# Patient Record
Sex: Female | Born: 1955 | Race: Black or African American | Hispanic: No | Marital: Single | Smoking: Former smoker
Health system: Southern US, Community
[De-identification: ages and names within clinical notes are randomized; demographics above are authoritative.]

## PROBLEM LIST (undated history)

## (undated) DIAGNOSIS — I2510 Atherosclerotic heart disease of native coronary artery without angina pectoris: Secondary | ICD-10-CM

## (undated) DIAGNOSIS — I739 Peripheral vascular disease, unspecified: Secondary | ICD-10-CM

## (undated) DIAGNOSIS — I509 Heart failure, unspecified: Secondary | ICD-10-CM

## (undated) DIAGNOSIS — E119 Type 2 diabetes mellitus without complications: Secondary | ICD-10-CM

## (undated) HISTORY — PX: FEMORAL ARTERY STENT: SHX1583

## (undated) HISTORY — PX: CORONARY STENT PLACEMENT: SHX1402

---

## 2020-02-07 ENCOUNTER — Emergency Department: Payer: Medicaid - Out of State

## 2020-02-07 ENCOUNTER — Other Ambulatory Visit: Payer: Self-pay

## 2020-02-07 ENCOUNTER — Encounter: Payer: Self-pay | Admitting: Radiology

## 2020-02-07 ENCOUNTER — Inpatient Hospital Stay
Admission: EM | Admit: 2020-02-07 | Discharge: 2020-02-13 | DRG: 163 | Disposition: A | Discharge status: Home / Self Care | Payer: Medicaid - Out of State | Attending: Internal Medicine | Admitting: Internal Medicine

## 2020-02-07 DIAGNOSIS — Z79891 Long term (current) use of opiate analgesic: Secondary | ICD-10-CM | POA: Diagnosis not present

## 2020-02-07 DIAGNOSIS — Z20822 Contact with and (suspected) exposure to covid-19: Secondary | ICD-10-CM | POA: Diagnosis present

## 2020-02-07 DIAGNOSIS — R778 Other specified abnormalities of plasma proteins: Secondary | ICD-10-CM | POA: Diagnosis present

## 2020-02-07 DIAGNOSIS — I700 Atherosclerosis of aorta: Secondary | ICD-10-CM | POA: Diagnosis present

## 2020-02-07 DIAGNOSIS — J9811 Atelectasis: Secondary | ICD-10-CM | POA: Diagnosis present

## 2020-02-07 DIAGNOSIS — M79606 Pain in leg, unspecified: Secondary | ICD-10-CM

## 2020-02-07 DIAGNOSIS — N183 Chronic kidney disease, stage 3 unspecified: Secondary | ICD-10-CM | POA: Diagnosis present

## 2020-02-07 DIAGNOSIS — Z7901 Long term (current) use of anticoagulants: Secondary | ICD-10-CM

## 2020-02-07 DIAGNOSIS — I2510 Atherosclerotic heart disease of native coronary artery without angina pectoris: Secondary | ICD-10-CM | POA: Diagnosis present

## 2020-02-07 DIAGNOSIS — J9601 Acute respiratory failure with hypoxia: Secondary | ICD-10-CM | POA: Diagnosis present

## 2020-02-07 DIAGNOSIS — I2692 Saddle embolus of pulmonary artery without acute cor pulmonale: Secondary | ICD-10-CM | POA: Diagnosis present

## 2020-02-07 DIAGNOSIS — N179 Acute kidney failure, unspecified: Secondary | ICD-10-CM | POA: Diagnosis present

## 2020-02-07 DIAGNOSIS — Z7982 Long term (current) use of aspirin: Secondary | ICD-10-CM

## 2020-02-07 DIAGNOSIS — E1122 Type 2 diabetes mellitus with diabetic chronic kidney disease: Secondary | ICD-10-CM | POA: Diagnosis present

## 2020-02-07 DIAGNOSIS — I2602 Saddle embolus of pulmonary artery with acute cor pulmonale: Secondary | ICD-10-CM | POA: Diagnosis not present

## 2020-02-07 DIAGNOSIS — E1169 Type 2 diabetes mellitus with other specified complication: Secondary | ICD-10-CM | POA: Diagnosis present

## 2020-02-07 DIAGNOSIS — I739 Peripheral vascular disease, unspecified: Secondary | ICD-10-CM | POA: Diagnosis present

## 2020-02-07 DIAGNOSIS — R710 Precipitous drop in hematocrit: Secondary | ICD-10-CM | POA: Diagnosis not present

## 2020-02-07 DIAGNOSIS — Z7902 Long term (current) use of antithrombotics/antiplatelets: Secondary | ICD-10-CM

## 2020-02-07 DIAGNOSIS — J439 Emphysema, unspecified: Secondary | ICD-10-CM | POA: Diagnosis present

## 2020-02-07 DIAGNOSIS — Z955 Presence of coronary angioplasty implant and graft: Secondary | ICD-10-CM

## 2020-02-07 DIAGNOSIS — Z87891 Personal history of nicotine dependence: Secondary | ICD-10-CM

## 2020-02-07 DIAGNOSIS — Z79899 Other long term (current) drug therapy: Secondary | ICD-10-CM

## 2020-02-07 DIAGNOSIS — R0602 Shortness of breath: Secondary | ICD-10-CM | POA: Diagnosis present

## 2020-02-07 DIAGNOSIS — I509 Heart failure, unspecified: Secondary | ICD-10-CM | POA: Diagnosis present

## 2020-02-07 DIAGNOSIS — I2699 Other pulmonary embolism without acute cor pulmonale: Secondary | ICD-10-CM | POA: Diagnosis present

## 2020-02-07 DIAGNOSIS — G8929 Other chronic pain: Secondary | ICD-10-CM | POA: Diagnosis present

## 2020-02-07 DIAGNOSIS — E1151 Type 2 diabetes mellitus with diabetic peripheral angiopathy without gangrene: Secondary | ICD-10-CM | POA: Diagnosis present

## 2020-02-07 DIAGNOSIS — N1831 Chronic kidney disease, stage 3a: Secondary | ICD-10-CM | POA: Diagnosis present

## 2020-02-07 HISTORY — DX: Type 2 diabetes mellitus without complications: E11.9

## 2020-02-07 HISTORY — DX: Peripheral vascular disease, unspecified: I73.9

## 2020-02-07 HISTORY — DX: Heart failure, unspecified: I50.9

## 2020-02-07 HISTORY — DX: Atherosclerotic heart disease of native coronary artery without angina pectoris: I25.10

## 2020-02-07 LAB — COMPREHENSIVE METABOLIC PANEL
ALT: 28 U/L (ref 0–44)
AST: 39 U/L (ref 15–41)
Albumin: 3.4 g/dL — ABNORMAL LOW (ref 3.5–5.0)
Alkaline Phosphatase: 68 U/L (ref 38–126)
Anion gap: 11 (ref 5–15)
BUN: 26 mg/dL — ABNORMAL HIGH (ref 8–23)
CO2: 24 mmol/L (ref 22–32)
Calcium: 8.6 mg/dL — ABNORMAL LOW (ref 8.9–10.3)
Chloride: 101 mmol/L (ref 98–111)
Creatinine, Ser: 1.78 mg/dL — ABNORMAL HIGH (ref 0.44–1.00)
GFR calc Af Amer: 35 mL/min — ABNORMAL LOW (ref >60)
GFR calc non Af Amer: 30 mL/min — ABNORMAL LOW (ref >60)
Glucose, Bld: 123 mg/dL — ABNORMAL HIGH (ref 70–99)
Potassium: 3.9 mmol/L (ref 3.5–5.1)
Sodium: 136 mmol/L (ref 135–145)
Total Bilirubin: 0.8 mg/dL (ref 0.3–1.2)
Total Protein: 8.9 g/dL — ABNORMAL HIGH (ref 6.5–8.1)

## 2020-02-07 LAB — PROTIME-INR
INR: 1.3 — ABNORMAL HIGH (ref 0.8–1.2)
Prothrombin Time: 15.7 seconds — ABNORMAL HIGH (ref 11.4–15.2)

## 2020-02-07 LAB — TROPONIN I (HIGH SENSITIVITY)
Troponin I (High Sensitivity): 127 ng/L (ref <18)
Troponin I (High Sensitivity): 127 ng/L (ref <18)

## 2020-02-07 LAB — CBC WITH DIFFERENTIAL/PLATELET
Abs Immature Granulocytes: 0.08 10*3/uL — ABNORMAL HIGH (ref 0.00–0.07)
Basophils Absolute: 0 10*3/uL (ref 0.0–0.1)
Basophils Relative: 0 %
Eosinophils Absolute: 0 10*3/uL (ref 0.0–0.5)
Eosinophils Relative: 0 %
HCT: 40 % (ref 36.0–46.0)
Hemoglobin: 12.6 g/dL (ref 12.0–15.0)
Immature Granulocytes: 1 %
Lymphocytes Relative: 8 %
Lymphs Abs: 1.1 10*3/uL (ref 0.7–4.0)
MCH: 28.1 pg (ref 26.0–34.0)
MCHC: 31.5 g/dL (ref 30.0–36.0)
MCV: 89.1 fL (ref 80.0–100.0)
Monocytes Absolute: 1.2 10*3/uL — ABNORMAL HIGH (ref 0.1–1.0)
Monocytes Relative: 9 %
Neutro Abs: 11 10*3/uL — ABNORMAL HIGH (ref 1.7–7.7)
Neutrophils Relative %: 82 %
Platelets: 200 10*3/uL (ref 150–400)
RBC: 4.49 MIL/uL (ref 3.87–5.11)
RDW: 15.1 % (ref 11.5–15.5)
WBC: 13.4 10*3/uL — ABNORMAL HIGH (ref 4.0–10.5)
nRBC: 0 % (ref 0.0–0.2)

## 2020-02-07 LAB — POC SARS CORONAVIRUS 2 AG: SARS Coronavirus 2 Ag: NEGATIVE

## 2020-02-07 LAB — APTT: aPTT: 30 seconds (ref 24–36)

## 2020-02-07 LAB — LIPASE, BLOOD: Lipase: 27 U/L (ref 11–51)

## 2020-02-07 LAB — SARS CORONAVIRUS 2 BY RT PCR (HOSPITAL ORDER, PERFORMED IN ~~LOC~~ HOSPITAL LAB): SARS Coronavirus 2: NEGATIVE

## 2020-02-07 LAB — BRAIN NATRIURETIC PEPTIDE: B Natriuretic Peptide: 299.2 pg/mL — ABNORMAL HIGH (ref 0.0–100.0)

## 2020-02-07 MED ORDER — HEPARIN BOLUS VIA INFUSION
4000.0000 [IU] | Freq: Once | INTRAVENOUS | Status: AC
  Administered 2020-02-07: 4000 [IU] via INTRAVENOUS
  Filled 2020-02-07: qty 4000

## 2020-02-07 MED ORDER — ACETAMINOPHEN 650 MG RE SUPP: 650.0000 mg | Freq: Four times a day (QID) | RECTAL | Status: DC | PRN

## 2020-02-07 MED ORDER — HEPARIN (PORCINE) 25000 UT/250ML-% IV SOLN
900.0000 [IU]/h | INTRAVENOUS | Status: DC
  Administered 2020-02-07: 1100 [IU]/h via INTRAVENOUS
  Administered 2020-02-08 – 2020-02-10 (×3): 900 [IU]/h via INTRAVENOUS
  Filled 2020-02-07 (×4): qty 250

## 2020-02-07 MED ORDER — ONDANSETRON HCL 4 MG PO TABS: 4.0000 mg | ORAL_TABLET | Freq: Four times a day (QID) | ORAL | Status: DC | PRN

## 2020-02-07 MED ORDER — SODIUM CHLORIDE 0.9 % IV BOLUS
500.0000 mL | Freq: Once | INTRAVENOUS | Status: AC
  Administered 2020-02-07: 500 mL via INTRAVENOUS

## 2020-02-07 MED ORDER — HYDROCODONE-ACETAMINOPHEN 5-325 MG PO TABS
1.0000 | ORAL_TABLET | ORAL | Status: DC | PRN
  Administered 2020-02-08: 2 via ORAL
  Administered 2020-02-08: 1 via ORAL
  Administered 2020-02-08 – 2020-02-09 (×5): 2 via ORAL
  Administered 2020-02-09: 1 via ORAL
  Administered 2020-02-10: 2 via ORAL
  Filled 2020-02-07 (×10): qty 2

## 2020-02-07 MED ORDER — ONDANSETRON HCL 4 MG/2ML IJ SOLN: 4.0000 mg | Freq: Four times a day (QID) | INTRAMUSCULAR | Status: DC | PRN

## 2020-02-07 MED ORDER — ACETAMINOPHEN 325 MG PO TABS: 650.0000 mg | ORAL_TABLET | Freq: Four times a day (QID) | ORAL | Status: DC | PRN

## 2020-02-07 MED ORDER — IOHEXOL 350 MG/ML SOLN
60.0000 mL | Freq: Once | INTRAVENOUS | Status: AC | PRN
  Administered 2020-02-07: 60 mL via INTRAVENOUS

## 2020-02-07 NOTE — ED Notes (Signed)
Patient placed on Non rebreather per Dr. Scotty Court for persistent sats in the 80's on 6L O2 via [place].

## 2020-02-07 NOTE — ED Notes (Signed)
Report given to Meagan RN.

## 2020-02-07 NOTE — ED Notes (Signed)
Patient given phone and assisted to call her sister. Patient is calm and cooperative. NAD. Patient is tolerating non-rebreather well.

## 2020-02-07 NOTE — ED Notes (Signed)
Patient is alert and oriented and was updating family via her own cell phone prior to being placed on the non-rebreather. Patient is tolerating well being on the non-rebreather at this time

## 2020-02-07 NOTE — ED Triage Notes (Signed)
Per EMS report, patient c/o shortness of breath today. Patient was 72% on room air. Patient was 100% on NRB, but dropped to 88% on 4L [place]. Patient is visiting from [place]. Patient has a history of CHF, stents. Patient is not on O2 at home.

## 2020-02-07 NOTE — ED Notes (Signed)
Patient taken to CT scan.

## 2020-02-07 NOTE — Progress Notes (Signed)
ANTICOAGULATION CONSULT NOTE - Initial Consult  Pharmacy Consult for Heparin  Indication: pulmonary embolus  No Known Allergies  Patient Measurements: Height: 5\' 6"  (167.6 cm) Weight: 65.8 kg (145 lb) IBW/kg (Calculated) : 59.3 Heparin Dosing Weight: 65.8 kg   Vital Signs: Temp: 99 F (37.2 C) (07/23 1643) Temp Source: Oral (07/23 1643) BP: 121/69 (07/23 1831) Pulse Rate: 73 (07/23 1914)  Labs: Recent Labs    02/07/20 1650 02/07/20 1723 02/07/20 1848  HGB 12.6  --   --   HCT 40.0  --   --   PLT 200  --   --   CREATININE  --  1.78*  --   TROPONINIHS  --  127* 127*    Estimated Creatinine Clearance: 30.7 mL/min (A) (by C-G formula based on SCr of 1.78 mg/dL (H)).   Medical History: History reviewed. No pertinent past medical history.  Medications:  (Not in a hospital admission)   Assessment: Pharmacy consulted to dose heparin in this 64 year old female with P.E.    CrCl = 30.7 ml/min ,  No prior anticoag noted  Goal of Therapy:  Heparin level 0.3-0.7 units/ml Monitor platelets by anticoagulation protocol: Yes   Plan:  Give 4000 units bolus x 1 Start heparin infusion at 1100 units/hr Check anti-Xa level in 6 hours and daily while on heparin Continue to monitor H&H and platelets  Rylin Seavey D 02/07/2020,8:33 PM

## 2020-02-07 NOTE — ED Provider Notes (Signed)
[facility] Emergency Department Provider Note  ____________________________________________  Time seen: Approximately 5:23 PM  I have reviewed the triage vital signs and the nursing notes.   HISTORY  Chief Complaint Shortness of Breath    HPI Megan James is a 64 y.o. female with a past history of CAD status post stents, PAD status post femoral stents, CHF, diabetes who complains of shortness of breath, gradual onset and worsening over the past 3 days, much worse today and associated with a right lower chest pain that is dull and hurts to breathe, nonradiating, not exertional.  Denies orthopnea but does have significant dyspnea on exertion.  Recently traveled from [place].  Does not use home oxygen.  EMS report a room air oxygen saturation of 72%.      Past medical history Diabetes, CAD, PAD   Patient Active Problem List   Diagnosis Date Noted  . Acute respiratory failure with hypoxia (HCC) 02/07/2020  . CAD (coronary artery disease) 02/07/2020  . PAD (peripheral artery disease) (HCC) 02/07/2020  . Elevated troponin 02/07/2020  . CKD (chronic kidney disease) stage 3, GFR 30-59 ml/min 02/07/2020  . Saddle embolus of pulmonary artery with acute cor pulmonale (HCC) 02/07/2020  . Acute saddle pulmonary embolism (HCC) 02/07/2020  . Type 2 diabetes mellitus with other specified complication (HCC) 02/07/2020   Past surgical history Right foot TMA     Prior to Admission medications   Medication Sig Start Date End Date Taking? Authorizing Provider  ASPIRIN LOW DOSE 81 MG EC tablet Take 81 mg by mouth daily. 01/21/20  Yes [provider]  atorvastatin (LIPITOR) 40 MG tablet Take 40 mg by mouth daily. 01/21/20  Yes [provider]  enalapril (VASOTEC) 20 MG tablet Take 20 mg by mouth daily. 01/21/20  Yes [provider]  GNP ACID REDUCER MAXIMUM ST 20 MG tablet Take 20 mg by mouth at bedtime. 01/21/20  Yes [provider]   isosorbide mononitrate (IMDUR) 60 MG 24 hr tablet Take 60 mg by mouth daily. 01/21/20  Yes [provider]  NIFEdipine (PROCARDIA XL/NIFEDICAL-XL) 90 MG 24 hr tablet Take 90 mg by mouth daily. 01/21/20  Yes [provider]  PLAVIX 75 MG tablet Take 75 mg by mouth daily. 01/22/20  Yes [provider]  Vitamin D, Ergocalciferol, (DRISDOL) 1.25 MG (50000 UNIT) CAPS capsule Take 50,000 Units by mouth once a week. 10/24/19  Yes [provider]  Unable to recall   Allergies Patient has no known allergies.   History reviewed. No pertinent family history.  Social History Social History   Tobacco Use  . Smoking status: Not on file  Substance Use Topics  . Alcohol use: Not on file  . Drug use: Not on file  Denies alcohol or tobacco use  Review of Systems  Constitutional:   No fever or chills.  ENT:   No sore throat. No rhinorrhea. Cardiovascular:   Positive chest pain as above without syncope. Respiratory: Positive shortness of breath and nonproductive cough. Gastrointestinal:   Negative for abdominal pain, vomiting and diarrhea.  Musculoskeletal:   Negative for focal pain or swelling All other systems reviewed and are negative except as documented above in ROS and HPI.  ____________________________________________   PHYSICAL EXAM:  VITAL SIGNS: ED Triage Vitals  Enc Vitals Group     BP 02/07/20 1643 (!) 130/67     Pulse Rate 02/07/20 1643 79     Resp 02/07/20 1643 (!) 24     Temp  02/07/20 1643 99 F (37.2 C)     Temp Source 02/07/20 1643 Oral     SpO2 02/07/20 1643 92 %     Weight 02/07/20 1646 145 lb (65.8 kg)     Height 02/07/20 1646 5\' 6"  (1.676 m)     Head Circumference --      Peak Flow --      Pain Score 02/07/20 1645 8     Pain Loc --      Pain Edu? --      Excl. in GC? --     Vital signs reviewed, nursing assessments reviewed.   Constitutional:   Alert and oriented.  Mild respiratory distress. Eyes:   Conjunctivae are normal.  EOMI. PERRL. ENT      Head:   Normocephalic and atraumatic.      Nose:   Normal      Mouth/Throat: Dry mucous membranes.      Neck:   No meningismus. Full ROM. Hematological/Lymphatic/Immunilogical:   No cervical lymphadenopathy. Cardiovascular:   RRR. Symmetric bilateral radial and DP pulses.  No murmurs. Cap refill less than 2 seconds. Respiratory:   Tachypnea.  Bilateral basilar crackles.  Symmetric air movement. Gastrointestinal:   Soft and nontender. Non distended. There is no CVA tenderness.  No rebound, rigidity, or guarding.  Musculoskeletal:   Normal range of motion in all extremities. No joint effusions.  No lower extremity tenderness, symmetric calf circumference.  Trace pedal edema bilaterally.  Right lower chest wall nontender. Neurologic:   Normal speech and language.  Motor grossly intact. No acute focal neurologic deficits are appreciated.  Skin:    Skin is warm, dry and intact. No rash noted.  No petechiae, purpura, or bullae.  ____________________________________________    LABS (pertinent positives/negatives) (all labs ordered are listed, but only abnormal results are displayed) Labs Reviewed  CBC WITH DIFFERENTIAL/PLATELET - Abnormal; Notable for the following components:      Result Value   WBC 13.4 (*)    Neutro Abs 11.0 (*)    Monocytes Absolute 1.2 (*)    Abs Immature Granulocytes 0.08 (*)    All other components within normal limits  BRAIN NATRIURETIC PEPTIDE - Abnormal; Notable for the following components:   B Natriuretic Peptide 299.2 (*)    All other components within normal limits  COMPREHENSIVE METABOLIC PANEL - Abnormal; Notable for the following components:   Glucose, Bld 123 (*)    BUN 26 (*)    Creatinine, Ser 1.78 (*)    Calcium 8.6 (*)    Total Protein 8.9 (*)    Albumin 3.4 (*)    GFR calc non Af Amer 30 (*)    GFR calc Af Amer 35 (*)    All other components within normal limits  PROTIME-INR - Abnormal; Notable for the following  components:   Prothrombin Time 15.7 (*)    INR 1.3 (*)    All other components within normal limits  TROPONIN I (HIGH SENSITIVITY) - Abnormal; Notable for the following components:   Troponin I (High Sensitivity) 127 (*)    All other components within normal limits  TROPONIN I (HIGH SENSITIVITY) - Abnormal; Notable for the following components:   Troponin I (High Sensitivity) 127 (*)    All other components within normal limits  SARS CORONAVIRUS 2 BY RT PCR (HOSPITAL ORDER, PERFORMED IN [facility])  LIPASE, BLOOD  APTT  HIV ANTIBODY (ROUTINE TESTING W REFLEX)  BASIC METABOLIC PANEL  CBC  HEPARIN LEVEL (UNFRACTIONATED)  POC SARS CORONAVIRUS 2 AG -  ED  POC SARS CORONAVIRUS 2 AG   ____________________________________________   EKG  Interpreted by me Sinus rhythm rate of 75, normal axis, normal intervals.  Poor R wave progression.  Normal ST segments and T waves, no acute ischemic changes.  ____________________________________________    RADIOLOGY  CT Angio Chest PE W and/or Wo Contrast  Result Date: 02/07/2020 CLINICAL DATA:  Shortness of breath EXAM: CT ANGIOGRAPHY CHEST WITH CONTRAST TECHNIQUE: Multidetector CT imaging of the chest was performed using the standard protocol during bolus administration of intravenous contrast. Multiplanar CT image reconstructions and MIPs were obtained to evaluate the vascular anatomy. CONTRAST:  60mL OMNIPAQUE IOHEXOL 350 MG/ML SOLN COMPARISON:  Chest x-ray 02/07/2020 FINDINGS: Cardiovascular: Satisfactory opacification of the pulmonary arteries to the segmental level. Small saddle embolus within the distal right pulmonary artery. Acute nonocclusive embolus within right descending pulmonary artery with extension of thrombus into right upper and lower lobe segmental and subsegmental vessels. Small amount of right middle lobe segmental and subsegmental thrombus. Acute left lower lobe segmental and subsegmental emboli. RV LV ratio  slightly elevated at 1.0. Dilated pulmonary trunk up to 4 cm. Ectatic ascending aorta without aneurysm. Mild aortic atherosclerosis. Coronary vascular calcification. Cardiomegaly with no significant pericardial effusion Mediastinum/Nodes: Midline trachea. No thyroid mass. No suspicious nodes. Lungs/Pleura: Mild emphysema. No consolidation or pleural effusion. Subsegmental atelectasis in the lower lobes. No pneumothorax. Upper Abdomen: No acute abnormality. Musculoskeletal: No chest wall abnormality. No acute or significant osseous findings. Review of the MIP images confirms the above findings. IMPRESSION: 1. Positive for acute right greater than left bilateral pulmonary emboli including small saddle embolus in the distal right pulmonary artery. Elevation of RV LV ratio at 1.0, suspect for component of right heart strain, however there are other features such as dilatation of the pulmonary trunk and cardiomegaly suggesting underlying chronic disease. 2. Subsegmental atelectasis at the lower lobes.  Mild emphysema Aortic Atherosclerosis (ICD10-I70.0) and Emphysema (ICD10-J43.9). Critical Value/emergent results were called by telephone at the time of interpretation on 02/07/2020 at 8:24 pm to provider Ramere Downs , who verbally acknowledged these results. Electronically Signed   By: Jasmine PangKim  Fujinaga M.D.   On: 02/07/2020 20:24   DG Chest Portable 1 View  Result Date: 02/07/2020 CLINICAL DATA:  Shortness of breath with hypoxia EXAM: PORTABLE CHEST 1 VIEW COMPARISON:  None. FINDINGS: Streaky bibasilar opacities. No pleural effusion. Borderline to mild cardiomegaly with aortic atherosclerosis. No pneumothorax. IMPRESSION: Streaky bibasilar opacities, favor atelectasis or scarring over atypical infiltrates. Electronically Signed   By: Jasmine PangKim  Fujinaga M.D.   On: 02/07/2020 17:29    ____________________________________________   PROCEDURES .Critical Care Performed by: Sharman CheekStafford, Zayvian Mcmurtry, MD Authorized by:  Sharman CheekStafford, Hanifa Antonetti, MD   Critical care provider statement:    Critical care time (minutes):  35   Critical care time was exclusive of:  Separately billable procedures and treating other patients   Critical care was necessary to treat or prevent imminent or life-threatening deterioration of the following conditions:  Respiratory failure   Critical care was time spent personally by me on the following activities:  Development of treatment plan with patient or surrogate, discussions with consultants, evaluation of patient's response to treatment, examination of patient, obtaining history from patient or surrogate, ordering and performing treatments and interventions, ordering and review of laboratory studies, ordering and review of radiographic studies, pulse oximetry, re-evaluation of patient's condition and review of old charts Comments:         Angiocath insertion  Date/Time: 02/07/2020 6:08 PM Performed by: Sharman Cheek, MD Authorized by: Sharman Cheek, MD  Preparation: Patient was prepped and draped in the usual sterile fashion. Local anesthesia used: no  Anesthesia: Local anesthesia used: no  Sedation: Patient sedated: no  Patient tolerance: patient tolerated the procedure well with no immediate complications Comments: 20g R AC, continuous ultrasound visualization    .1-3 Lead EKG Interpretation Performed by: Sharman Cheek, MD Authorized by: Sharman Cheek, MD     Interpretation: normal     ECG rate:  80   ECG rate assessment: normal     Rhythm: sinus rhythm     Ectopy: none     Conduction: normal      ____________________________________________  DIFFERENTIAL DIAGNOSIS   Pulmonary embolism, pulmonary edema, non-STEMI, pneumonia, pneumothorax  CLINICAL IMPRESSION / ASSESSMENT AND PLAN / ED COURSE  Medications ordered in the ED: Medications  heparin ADULT infusion 100 units/mL (25000 units/234mL sodium chloride 0.45%) (1,100 Units/hr Intravenous  New Bag/Given 02/07/20 2124)  acetaminophen (TYLENOL) tablet 650 mg (has no administration in time range)    Or  acetaminophen (TYLENOL) suppository 650 mg (has no administration in time range)  HYDROcodone-acetaminophen (NORCO/VICODIN) 5-325 MG per tablet 1-2 tablet (has no administration in time range)  ondansetron (ZOFRAN) tablet 4 mg (has no administration in time range)    Or  ondansetron (ZOFRAN) injection 4 mg (has no administration in time range)  sodium chloride 0.9 % bolus 500 mL (0 mLs Intravenous Stopped 02/07/20 1946)  iohexol (OMNIPAQUE) 350 MG/ML injection 60 mL (60 mLs Intravenous Contrast Given 02/07/20 1956)  heparin bolus via infusion 4,000 Units (4,000 Units Intravenous Bolus from Bag 02/07/20 2125)    Pertinent labs & imaging results that were available during my care of the patient were reviewed by me and considered in my medical decision making (see chart for details).  Megan James was evaluated in Emergency Department on 02/08/2020 for the symptoms described in the history of present illness. She was evaluated in the context of the global COVID-19 pandemic, which necessitated consideration that the patient might be at risk for infection with the SARS-CoV-2 virus that causes COVID-19. Institutional protocols and algorithms that pertain to the evaluation of patients at risk for COVID-19 are in a state of rapid change based on information released by regulatory bodies including the CDC and federal and state organizations. These policies and algorithms were followed during the patient's care in the ED.   Patient presents with shortness of breath and hypoxic respiratory failure, room air oxygen saturation in the ED is 70%, increased to 94 to 96% on 6 L nasal cannula.  I doubt pneumonia or sepsis on initial presentation.  I think that her presentation is most concerning for pulmonary embolism or pulmonary edema.  I will obtain a chest x-ray and labs, may need CT scan of the chest.  Will  need to hospitalize until further stabilized.  Clinical Course as of Feb 08 12  Fri Feb 07, 2020  2007 CT scan shows bilateral PEs.  Will start heparin, plan to admit.   [PS]  2312 Case discussed with Dr. Warrick Parisian who accepts for transfer to [facility] ICU under service of Dr. Cyril Mourning for [facility] intervention.     [PS]  Sat Feb 08, 2020  0013 Received callback from [facility] confirming pt on transfer list, awaiting ICU bed assignment.   [PS]    Clinical Course User Index [PS] Sharman Cheek, MD     ____________________________________________   FINAL CLINICAL IMPRESSION(S) /  ED DIAGNOSES    Final diagnoses:  Acute respiratory failure with hypoxia [facility])     ED Discharge Orders    None      Portions of this note were generated with dragon dictation software. Dictation errors may occur despite best attempts at proofreading.   Sharman Cheek, MD 02/08/20 [id number]

## 2020-02-07 NOTE — ED Notes (Signed)
Patient has an assigned bed, but still unclear whether patient is going to be transferred or not. Dr. Scotty Court informed.

## 2020-02-08 ENCOUNTER — Encounter: Payer: Self-pay | Admitting: Pulmonary Disease

## 2020-02-08 ENCOUNTER — Inpatient Hospital Stay
Admission: AD | Admit: 2020-02-08 | Payer: Medicaid - Out of State | Source: Other Acute Inpatient Hospital | Admitting: Pulmonary Disease

## 2020-02-08 DIAGNOSIS — I2699 Other pulmonary embolism without acute cor pulmonale: Secondary | ICD-10-CM | POA: Diagnosis present

## 2020-02-08 LAB — CBC
HCT: 39.7 % (ref 36.0–46.0)
Hemoglobin: 12.6 g/dL (ref 12.0–15.0)
MCH: 28.5 pg (ref 26.0–34.0)
MCHC: 31.7 g/dL (ref 30.0–36.0)
MCV: 89.8 fL (ref 80.0–100.0)
Platelets: 185 10*3/uL (ref 150–400)
RBC: 4.42 MIL/uL (ref 3.87–5.11)
RDW: 15 % (ref 11.5–15.5)
WBC: 12.7 10*3/uL — ABNORMAL HIGH (ref 4.0–10.5)
nRBC: 0 % (ref 0.0–0.2)

## 2020-02-08 LAB — BASIC METABOLIC PANEL
Anion gap: 9 (ref 5–15)
BUN: 23 mg/dL (ref 8–23)
CO2: 24 mmol/L (ref 22–32)
Calcium: 8.3 mg/dL — ABNORMAL LOW (ref 8.9–10.3)
Chloride: 105 mmol/L (ref 98–111)
Creatinine, Ser: 1.37 mg/dL — ABNORMAL HIGH (ref 0.44–1.00)
GFR calc Af Amer: 48 mL/min — ABNORMAL LOW (ref >60)
GFR calc non Af Amer: 41 mL/min — ABNORMAL LOW (ref >60)
Glucose, Bld: 82 mg/dL (ref 70–99)
Potassium: 4.2 mmol/L (ref 3.5–5.1)
Sodium: 138 mmol/L (ref 135–145)

## 2020-02-08 LAB — FIBRINOGEN: Fibrinogen: 690 mg/dL — ABNORMAL HIGH (ref 210–475)

## 2020-02-08 LAB — PHOSPHORUS: Phosphorus: 2.5 mg/dL (ref 2.5–4.6)

## 2020-02-08 LAB — HEMOGLOBIN A1C
Hgb A1c MFr Bld: 6.5 % — ABNORMAL HIGH (ref 4.8–5.6)
Mean Plasma Glucose: 139.85 mg/dL

## 2020-02-08 LAB — BLOOD GAS, ARTERIAL
Acid-base deficit: 2 mmol/L (ref 0.0–2.0)
Allens test (pass/fail): POSITIVE — AB
Bicarbonate: 23.1 mmol/L (ref 20.0–28.0)
FIO2: 52
O2 Saturation: 91.4 %
Patient temperature: 37
pCO2 arterial: 40 mmHg (ref 32.0–48.0)
pH, Arterial: 7.37 (ref 7.350–7.450)
pO2, Arterial: 64 mmHg — ABNORMAL LOW (ref 83.0–108.0)

## 2020-02-08 LAB — GLUCOSE, CAPILLARY
Glucose-Capillary: 81 mg/dL (ref 70–99)
Glucose-Capillary: 72 mg/dL (ref 70–99)
Glucose-Capillary: 111 mg/dL — ABNORMAL HIGH (ref 70–99)
Glucose-Capillary: 103 mg/dL — ABNORMAL HIGH (ref 70–99)
Glucose-Capillary: 126 mg/dL — ABNORMAL HIGH (ref 70–99)

## 2020-02-08 LAB — HEPARIN LEVEL (UNFRACTIONATED)
Heparin Unfractionated: 0.79 IU/mL — ABNORMAL HIGH (ref 0.30–0.70)
Heparin Unfractionated: 0.7 IU/mL (ref 0.30–0.70)
Heparin Unfractionated: 0.65 IU/mL (ref 0.30–0.70)

## 2020-02-08 LAB — TROPONIN I (HIGH SENSITIVITY): Troponin I (High Sensitivity): 50 ng/L — ABNORMAL HIGH (ref <18)

## 2020-02-08 LAB — HIV ANTIBODY (ROUTINE TESTING W REFLEX): HIV Screen 4th Generation wRfx: NONREACTIVE

## 2020-02-08 LAB — MRSA PCR SCREENING: MRSA by PCR: NEGATIVE

## 2020-02-08 LAB — MAGNESIUM: Magnesium: 1.6 mg/dL — ABNORMAL LOW (ref 1.7–2.4)

## 2020-02-08 MED ORDER — POLYETHYLENE GLYCOL 3350 17 G PO PACK: 17.0000 g | PACK | Freq: Every day | ORAL | Status: DC | PRN

## 2020-02-08 MED ORDER — BLISTEX MEDICATED EX OINT
TOPICAL_OINTMENT | CUTANEOUS | Status: DC | PRN
  Filled 2020-02-08: qty 6.3

## 2020-02-08 MED ORDER — MAGNESIUM SULFATE 4 GM/100ML IV SOLN
4.0000 g | Freq: Once | INTRAVENOUS | Status: AC
  Administered 2020-02-08: 4 g via INTRAVENOUS
  Filled 2020-02-08: qty 100

## 2020-02-08 MED ORDER — ATORVASTATIN CALCIUM 20 MG PO TABS
40.0000 mg | ORAL_TABLET | Freq: Every day | ORAL | Status: DC
  Administered 2020-02-08 – 2020-02-13 (×5): 40 mg via ORAL
  Filled 2020-02-08 (×5): qty 2

## 2020-02-08 MED ORDER — FAMOTIDINE IN NACL 20-0.9 MG/50ML-% IV SOLN
20.0000 mg | INTRAVENOUS | Status: DC
  Administered 2020-02-08: 20 mg via INTRAVENOUS
  Filled 2020-02-08: qty 50

## 2020-02-08 MED ORDER — METHADONE HCL 10 MG PO TABS: 40.0000 mg | ORAL_TABLET | Freq: Every day | ORAL | Status: DC

## 2020-02-08 MED ORDER — INSULIN ASPART 100 UNIT/ML ~~LOC~~ SOLN: 0.0000 [IU] | Freq: Every day | SUBCUTANEOUS | Status: DC

## 2020-02-08 MED ORDER — CHLORHEXIDINE GLUCONATE CLOTH 2 % EX PADS
6.0000 | MEDICATED_PAD | Freq: Every day | CUTANEOUS | Status: DC
  Filled 2020-02-08: qty 6

## 2020-02-08 MED ORDER — INSULIN ASPART 100 UNIT/ML ~~LOC~~ SOLN: 0.0000 [IU] | Freq: Three times a day (TID) | SUBCUTANEOUS | Status: DC

## 2020-02-08 MED ORDER — DOCUSATE SODIUM 100 MG PO CAPS
100.0000 mg | ORAL_CAPSULE | Freq: Every day | ORAL | Status: DC
  Administered 2020-02-09 – 2020-02-11 (×2): 100 mg via ORAL
  Filled 2020-02-08 (×2): qty 1

## 2020-02-08 MED ORDER — METHADONE HCL 10 MG PO TABS
40.0000 mg | ORAL_TABLET | Freq: Every day | ORAL | Status: DC
  Administered 2020-02-08 – 2020-02-09 (×2): 40 mg via ORAL
  Filled 2020-02-08 (×2): qty 4

## 2020-02-08 MED ORDER — DOCUSATE SODIUM 100 MG PO CAPS: 100.0000 mg | ORAL_CAPSULE | Freq: Two times a day (BID) | ORAL | Status: DC | PRN

## 2020-02-08 NOTE — Progress Notes (Signed)
Shift summary:  - Patient transitioned from NRB to 8 L/min [place]. - Planning for thrombectomy on Monday.

## 2020-02-08 NOTE — Consult Note (Signed)
[facility] Vascular Consult Note  MRN : [id number]  Megan James is a 64 y.o. ([birth year]) female who presents with chief complaint of  Chief Complaint  Patient presents with  . Shortness of Breath  .  History of Present Illness: Patient with DM, CHF, CAD, PVD- prior lower extremity interventions, Right TMA presented with progressive shortness of breath after returning from [place]. RA sats were in the 70s. CTA chest revealed bilateral pulmonary emboli with small saddle embolus along with possible component of right heart strain.  Current Facility-Administered Medications  Medication Dose Route Frequency Provider Last Rate Last Admin  . acetaminophen (TYLENOL) tablet 650 mg  650 mg Oral Q6H PRN Andris Baumann, MD       Or  . acetaminophen (TYLENOL) suppository 650 mg  650 mg Rectal Q6H PRN Andris Baumann, MD      . atorvastatin (LIPITOR) tablet 40 mg  40 mg Oral Daily Eugenie Norrie, NP      . Chlorhexidine Gluconate Cloth 2 % PADS 6 each  6 each Topical Daily Vida Rigger, MD      . docusate sodium (COLACE) capsule 100 mg  100 mg Oral BID PRN Eugenie Norrie, NP      . docusate sodium (COLACE) capsule 100 mg  100 mg Oral Daily Eugenie Norrie, NP      . famotidine (PEPCID) IVPB 20 mg premix  20 mg Intravenous Q24H Eugenie Norrie, NP 100 mL/hr at 02/08/20 0830 20 mg at 02/08/20 0830  . heparin ADULT infusion 100 units/mL (25000 units/277mL sodium chloride 0.45%)  950 Units/hr Intravenous Continuous Hall, Scott A, RPH 9.5 mL/hr at 02/08/20 0800 950 Units/hr at 02/08/20 0800  . HYDROcodone-acetaminophen (NORCO/VICODIN) 5-325 MG per tablet 1-2 tablet  1-2 tablet Oral Q4H PRN Andris Baumann, MD   1 tablet at 02/08/20 [phone]  . insulin aspart (novoLOG) injection 0-5 Units  0-5 Units Subcutaneous QHS Eugenie Norrie, NP      . insulin aspart (novoLOG) injection 0-9 Units  0-9 Units Subcutaneous TID WC Eugenie Norrie, NP      . lip balm (BLISTEX) ointment    Topical PRN Eugenie Norrie, NP      . methadone (DOLOPHINE) tablet 40 mg  40 mg Oral Daily Eugenie Norrie, NP   40 mg at 02/08/20 [id number]  . ondansetron (ZOFRAN) tablet 4 mg  4 mg Oral Q6H PRN Andris Baumann, MD       Or  . ondansetron [facility]) injection 4 mg  4 mg Intravenous Q6H PRN Andris Baumann, MD      . polyethylene glycol (MIRALAX / GLYCOLAX) packet 17 g  17 g Oral Daily PRN Eugenie Norrie, NP        Past Medical History:  Diagnosis Date  . CAD (coronary artery disease)   . CHF (congestive heart failure) (HCC)   . PAD (peripheral artery disease) (HCC)   . Type 2 diabetes mellitus (HCC)     Past Surgical History:  Procedure Laterality Date  . CORONARY STENT PLACEMENT    . FEMORAL ARTERY STENT      Social History Social History   Tobacco Use  . Smoking status: Not on file  Substance Use Topics  . Alcohol use: Not on file  . Drug use: Not Currently    Family History History reviewed. No pertinent family history.  No Known Allergies   REVIEW OF SYSTEMS (Negative unless checked)  Constitutional: []   Weight loss  [] Fever  [] Chills Cardiac: [x] Chest pain   [] Chest pressure   [] Palpitations   [] Shortness of breath when laying flat   [x] Shortness of breath at rest   [x] Shortness of breath with exertion. Vascular:  [] Pain in legs with walking   [] Pain in legs at rest   [] Pain in legs when laying flat   [] Claudication   [] Pain in feet when walking  [] Pain in feet at rest  [] Pain in feet when laying flat   [] History of DVT   [] Phlebitis   [] Swelling in legs   [] Varicose veins   [] Non-healing ulcers Pulmonary:   [] Uses home oxygen   [] Productive cough   [] Hemoptysis   [] Wheeze  [] COPD   [] Asthma Neurologic:  [] Dizziness  [] Blackouts   [] Seizures   [] History of stroke   [] History of TIA  [] Aphasia   [] Temporary blindness   [] Dysphagia   [] Weakness or numbness in arms   [] Weakness or numbness in legs Musculoskeletal:  [] Arthritis   [] Joint swelling   [] Joint pain   [] Low  back pain Hematologic:  [] Easy bruising  [] Easy bleeding   [] Hypercoagulable state   [] Anemic  [] Hepatitis Gastrointestinal:  [] Blood in stool   [] Vomiting blood  [] Gastroesophageal reflux/heartburn   [] Difficulty swallowing. Genitourinary:  [] Chronic kidney disease   [] Difficult urination  [] Frequent urination  [] Burning with urination   [] Blood in urine Skin:  [] Rashes   [] Ulcers   [] Wounds Psychological:  [] History of anxiety   []  History of major depression.  Physical Examination  Vitals:   02/08/20 0600 02/08/20 0700 02/08/20 0730 02/08/20 0800  BP: (!) 123/49 118/83  102/70  Pulse: 73 66 65 64  Resp: 22 23 19 20   Temp:   98.9 F (37.2 C)   TempSrc:   Axillary   SpO2: 97% 99% 96% 97%  Weight:      Height:       Body mass index is 24.02 kg/m. Gen:  WD/WN, NAD Neck: Trachea midline.  No JVD.  Pulmonary:  Diminished bilaterally Cardiac: RRR, normal S1, S2. Vascular:  Vessel Right Left  Radial Palpable Palpable  Ulnar Palpable Palpable  Brachial Palpable Palpable  Carotid Palpable, without bruit Palpable, without bruit  Aorta Not palpable N/A  Femoral Palpable Palpable  Popliteal    PT    DP warm warm   Gastrointestinal: soft, non-tender/non-distended. No guarding/reflex.  Musculoskeletal: M/S 5/5 throughout.  Extremities without ischemic changes.  RIGHT TMA. No edema. Neurologic: Sensation grossly intact in extremities.  Symmetrical.  Speech is fluent. Motor exam as listed above. Dermatologic: No rashes or ulcers noted.  No cellulitis or open wounds.       CBC Lab Results  Component Value Date   WBC 12.7 (H) 02/08/2020   HGB 12.6 02/08/2020   HCT 39.7 02/08/2020   MCV 89.8 02/08/2020   PLT 185 02/08/2020    BMET    Component Value Date/Time   NA 138 02/08/2020 0427   K 4.2 02/08/2020 0427   CL 105 02/08/2020 0427   CO2 24 02/08/2020 0427   GLUCOSE 82 02/08/2020 0427   BUN 23 02/08/2020 0427   CREATININE 1.37 (H) 02/08/2020 0427   CALCIUM 8.3 (L)  02/08/2020 0427   GFRNONAA 41 (L) 02/08/2020 0427   GFRAA 48 (L) 02/08/2020 0427   Estimated Creatinine Clearance: 39.9 mL/min (A) (by C-G formula based on SCr of 1.37 mg/dL (H)).  COAG Lab Results  Component Value Date   INR 1.3 (H) 02/07/2020    Radiology CT  Angio Chest PE W and/or Wo Contrast  Result Date: 02/07/2020 CLINICAL DATA:  Shortness of breath EXAM: CT ANGIOGRAPHY CHEST WITH CONTRAST TECHNIQUE: Multidetector CT imaging of the chest was performed using the standard protocol during bolus administration of intravenous contrast. Multiplanar CT image reconstructions and MIPs were obtained to evaluate the vascular anatomy. CONTRAST:  59mL OMNIPAQUE IOHEXOL 350 MG/ML SOLN COMPARISON:  Chest x-ray 02/07/2020 FINDINGS: Cardiovascular: Satisfactory opacification of the pulmonary arteries to the segmental level. Small saddle embolus within the distal right pulmonary artery. Acute nonocclusive embolus within right descending pulmonary artery with extension of thrombus into right upper and lower lobe segmental and subsegmental vessels. Small amount of right middle lobe segmental and subsegmental thrombus. Acute left lower lobe segmental and subsegmental emboli. RV LV ratio slightly elevated at 1.0. Dilated pulmonary trunk up to 4 cm. Ectatic ascending aorta without aneurysm. Mild aortic atherosclerosis. Coronary vascular calcification. Cardiomegaly with no significant pericardial effusion Mediastinum/Nodes: Midline trachea. No thyroid mass. No suspicious nodes. Lungs/Pleura: Mild emphysema. No consolidation or pleural effusion. Subsegmental atelectasis in the lower lobes. No pneumothorax. Upper Abdomen: No acute abnormality. Musculoskeletal: No chest wall abnormality. No acute or significant osseous findings. Review of the MIP images confirms the above findings. IMPRESSION: 1. Positive for acute right greater than left bilateral pulmonary emboli including small saddle embolus in the distal right  pulmonary artery. Elevation of RV LV ratio at 1.0, suspect for component of right heart strain, however there are other features such as dilatation of the pulmonary trunk and cardiomegaly suggesting underlying chronic disease. 2. Subsegmental atelectasis at the lower lobes.  Mild emphysema Aortic Atherosclerosis (ICD10-I70.0) and Emphysema (ICD10-J43.9). Critical Value/emergent results were called by telephone at the time of interpretation on 02/07/2020 at 8:24 pm to provider PHILLIP STAFFORD , who verbally acknowledged these results. Electronically Signed   By: Jasmine Pang M.D.   On: 02/07/2020 20:24   DG Chest Portable 1 View  Result Date: 02/07/2020 CLINICAL DATA:  Shortness of breath with hypoxia EXAM: PORTABLE CHEST 1 VIEW COMPARISON:  None. FINDINGS: Streaky bibasilar opacities. No pleural effusion. Borderline to mild cardiomegaly with aortic atherosclerosis. No pneumothorax. IMPRESSION: Streaky bibasilar opacities, favor atelectasis or scarring over atypical infiltrates. Electronically Signed   By: Jasmine Pang M.D.   On: 02/07/2020 17:29      Assessment/Plan 1.Bilateral Pulmonary PEs 2. Continue Heparin gtt 3.Plan for Thrombectomy on Monday   Bertram Denver, MD  02/08/2020 9:07 AM    This note was created with Dragon medical transcription system.  Any error is purely unintentional

## 2020-02-08 NOTE — H&P (Addendum)
Name: Megan James MRN: [id number] DOB: September 18, 1955    ADMISSION DATE:  02/07/2020 CONSULTATION DATE: 02/08/2020  REFERRING MD : Dr. Scotty Court   CHIEF COMPLAINT: Shortness of Breath   BRIEF PATIENT DESCRIPTION:  64 yo female admitted with acute hypoxic respiratory failure secondary to bilateral pulmonary emboli including small saddle embolus in the distal right pulmonary artery  SIGNIFICANT EVENTS/STUDIES:  07/23: Pt presented to [facility] ER with shortness of breath  07/23: CTA Chest positive for acute right greater than left bilateral pulmonary emboli including small saddle embolus in the distal right pulmonary artery. Elevation of RV LV ratio at 1.0, suspect for component of right heart strain, however there are other features such as dilatation of the pulmonary trunk and cardiomegaly suggesting underlying chronic disease. Subsegmental atelectasis at the lower lobes.  Mild emphysema Aortic Atherosclerosis (ICD10-I70.0) and Emphysema (ICD10-J43.9). 07/23: ER physician contacted [facility] PCCM team for possible pt transfer for [facility] intervention and pt accepted and placed on transfer waiting list  07/24: Pt became hypoxic requiring NRB while in the ER pending transfer to [facility] contacted by [facility] physician Dr. Warrick Parisian for possible ICU admission at [facility].  Pt admitted to [facility] ICU   HISTORY OF PRESENT ILLNESS:   This is a 64 yo female with a PMH of Type II Diabetes Mellitus, PAD s/p femoral stents, CHF, and CAD.  She presented to [facility] ER on 07/23 via EMS with c/o worsening shortness of breath and right lower chest pain onset of symptoms 3 days prior to ER physician.  She also reports recent travel from [place] where she resides. Upon arrival to the ER pts O2 sats on RA were in the 70's, therefore she was placed on 6L via nasal canula with O2 sats increasing to 94 to 96%.  Lab results revealed glucose 123, BUN 26, creatinine 1.78, calcium 8.6, BNP 299.2, and troponin 127.  COVID-19  negative, however CXR revealed streaky bibasilar opacities favoring atelectasis.  CTA chest revealed bilateral pulmonary emboli with small saddle embolus along with possible component of right heart strain. She was subsequently admitted to ICU per PCCM team.  Detailed hospital course outlined above under significant events/studies.    PAST MEDICAL HISTORY :   has a past medical history of CAD (coronary artery disease), CHF (congestive heart failure) (HCC), PAD (peripheral artery disease) (HCC), and Type 2 diabetes mellitus (HCC).  has a past surgical history that includes Femoral artery stent and Coronary stent placement. Prior to Admission medications   Medication Sig Start Date End Date Taking? Authorizing Provider  ASPIRIN LOW DOSE 81 MG EC tablet Take 81 mg by mouth daily. 01/21/20  Yes [provider]  atorvastatin (LIPITOR) 40 MG tablet Take 40 mg by mouth daily. 01/21/20  Yes [provider]  enalapril (VASOTEC) 20 MG tablet Take 20 mg by mouth daily. 01/21/20  Yes [provider]  GNP ACID REDUCER MAXIMUM ST 20 MG tablet Take 20 mg by mouth at bedtime. 01/21/20  Yes [provider]  isosorbide mononitrate (IMDUR) 60 MG 24 hr tablet Take 60 mg by mouth daily. 01/21/20  Yes [provider]  NIFEdipine (PROCARDIA XL/NIFEDICAL-XL) 90 MG 24 hr tablet Take 90 mg by mouth daily. 01/21/20  Yes [provider]  PLAVIX 75 MG tablet Take 75 mg by mouth daily. 01/22/20  Yes [provider]  Vitamin D, Ergocalciferol, (DRISDOL) 1.25 MG (50000 UNIT) CAPS capsule Take 50,000 Units by mouth once a week. 10/24/19  Yes [provider]   No Known Allergies  FAMILY HISTORY:  family history is not on file. SOCIAL HISTORY:    REVIEW OF SYSTEMS: Positives in BOLD  Constitutional: Negative for fever, chills, weight loss, malaise/fatigue and diaphoresis.  HENT: Negative for hearing loss, ear pain, nosebleeds, congestion, sore throat, neck pain, tinnitus  and ear discharge.   Eyes: Negative for blurred vision, double vision, photophobia, pain, discharge and redness.  Respiratory: cough, hemoptysis, sputum production, shortness of breath, wheezing and stridor.   Cardiovascular: chest pain, palpitations, orthopnea, claudication, leg swelling and PND.  Gastrointestinal: Negative for heartburn, nausea, vomiting, abdominal pain, diarrhea, constipation, blood in stool and melena.  Genitourinary: Negative for dysuria, urgency, frequency, hematuria and flank pain.  Musculoskeletal: Negative for myalgias, back pain, joint pain and falls.  Skin: Negative for itching and rash.  Neurological: Negative for dizziness, tingling, tremors, sensory change, speech change, focal weakness, seizures, loss of consciousness, weakness and headaches.  Endo/Heme/Allergies: Negative for environmental allergies and polydipsia. Does not bruise/bleed easily.  SUBJECTIVE:  States shortness of breath and chest discomfort has improved   VITAL SIGNS: Temp:  [99 F (37.2 C)] 99 F (37.2 C) (07/23 1643) Pulse Rate:  [61-79] 63 (07/24 0153) Resp:  [16-28] 24 (07/24 0153) BP: (102-130)/(64-85) 102/85 (07/24 0153) SpO2:  [92 %-100 %] 99 % (07/24 0153) Weight:  [65.8 kg] 65.8 kg (07/23 1646)  PHYSICAL EXAMINATION: General: acutely ill appearing female resting in bed, NAD with NRB in place  Neuro: alert and oriented, follows commands  HEENT: supple, no JVD  Cardiovascular: nsr, rrr, no R/G Lungs: diminished throughout, even, non labored Abdomen: +BS x4, soft, non tender, non distended  Musculoskeletal: normal bulk and tone, no edema  Skin: intact no rashes or lesions present   Recent Labs  Lab 02/07/20 1723  NA 136  K 3.9  CL 101  CO2 24  BUN 26*  CREATININE 1.78*  GLUCOSE 123*   Recent Labs  Lab 02/07/20 1650  HGB 12.6  HCT 40.0  WBC 13.4*  PLT 200   CT Angio Chest PE W and/or Wo Contrast  Result Date: 02/07/2020 CLINICAL DATA:  Shortness of breath  EXAM: CT ANGIOGRAPHY CHEST WITH CONTRAST TECHNIQUE: Multidetector CT imaging of the chest was performed using the standard protocol during bolus administration of intravenous contrast. Multiplanar CT image reconstructions and MIPs were obtained to evaluate the vascular anatomy. CONTRAST:  32mL OMNIPAQUE IOHEXOL 350 MG/ML SOLN COMPARISON:  Chest x-ray 02/07/2020 FINDINGS: Cardiovascular: Satisfactory opacification of the pulmonary arteries to the segmental level. Small saddle embolus within the distal right pulmonary artery. Acute nonocclusive embolus within right descending pulmonary artery with extension of thrombus into right upper and lower lobe segmental and subsegmental vessels. Small amount of right middle lobe segmental and subsegmental thrombus. Acute left lower lobe segmental and subsegmental emboli. RV LV ratio slightly elevated at 1.0. Dilated pulmonary trunk up to 4 cm. Ectatic ascending aorta without aneurysm. Mild aortic atherosclerosis. Coronary vascular calcification. Cardiomegaly with no significant pericardial effusion Mediastinum/Nodes: Midline trachea. No thyroid mass. No suspicious nodes. Lungs/Pleura: Mild emphysema. No consolidation or pleural effusion. Subsegmental atelectasis in the lower lobes. No pneumothorax. Upper Abdomen: No acute abnormality. Musculoskeletal: No chest wall abnormality. No acute or significant osseous findings. Review of the MIP images confirms the above findings. IMPRESSION: 1. Positive for acute right greater than left bilateral pulmonary emboli including small saddle embolus in the distal right pulmonary artery. Elevation of RV LV ratio at 1.0, suspect for component of right heart strain, however there  are other features such as dilatation of the pulmonary trunk and cardiomegaly suggesting underlying chronic disease. 2. Subsegmental atelectasis at the lower lobes.  Mild emphysema Aortic Atherosclerosis (ICD10-I70.0) and Emphysema (ICD10-J43.9). Critical  Value/emergent results were called by telephone at the time of interpretation on 02/07/2020 at 8:24 pm to provider PHILLIP STAFFORD , who verbally acknowledged these results. Electronically Signed   By: Jasmine Pang M.D.   On: 02/07/2020 20:24   DG Chest Portable 1 View  Result Date: 02/07/2020 CLINICAL DATA:  Shortness of breath with hypoxia EXAM: PORTABLE CHEST 1 VIEW COMPARISON:  None. FINDINGS: Streaky bibasilar opacities. No pleural effusion. Borderline to mild cardiomegaly with aortic atherosclerosis. No pneumothorax. IMPRESSION: Streaky bibasilar opacities, favor atelectasis or scarring over atypical infiltrates. Electronically Signed   By: Jasmine Pang M.D.   On: 02/07/2020 17:29    ASSESSMENT / PLAN:  Acute hypoxic respiratory failure secondary to bilateral pulmonary emboli  including small saddle embolus in the distal right pulmonary artery and atelectasis  Elevated troponin likely demand ischemia secondary to bilateral pulmonary emboli Hx: CAD  Supplemental O2 for dyspnea and/or hypoxia  Continue heparin gtt dosing per pharmacy  Will hold outpatient antihypertensives for now  Continue outpatient atorvastatin  Echo pending  Trend troponin's  Will consult vascular surgery appreciate input  Fibrinogen and repeat troponin pending  Trend CBC Monitor for s/sx of bleeding and transfuse for hgb <7 Aggressive pulmonary hygiene  Prn norco for pain management   Acute renal failure  Trend BMP  Replace electrolytes as indicated Monitor UOP  Avoid nephrotoxic medications   Type II Diabetes Mellitus CBG's ac/hs  SSI   -Per EICU Intensivist Dr. Jaynie Crumble request case staffed with Dr. Karna Christmas via telephone to determine if pt requires intravenous TPA. Following detailed discussion with Dr. Karna Christmas regarding plan of care at this time he does NOT want to start IV TPA due to pts stable vital signs and high sensitivity troponin result of 127, but he plans to consult with [facility] Vascular Team  during dayshift to determine if pt requires vascular intervention.  Sonda Rumble, AGNP  Pulmonary/Critical Care Pager [phone] (please enter 7 digits) PCCM Consult Pager [phone] (please enter 7 digits)

## 2020-02-08 NOTE — Progress Notes (Signed)
ANTICOAGULATION CONSULT NOTE -  Pharmacy Consult for Heparin  Indication: pulmonary embolus  No Known Allergies  Patient Measurements: Height: 5\' 6"  (167.6 cm) Weight: 67.5 kg (148 lb 13 oz) IBW/kg (Calculated) : 59.3 Heparin Dosing Weight: 65.8 kg   Vital Signs: Temp: 98.8 F (37.1 C) (07/24 1200) Temp Source: Axillary (07/24 1200) BP: 137/71 (07/24 1305) Pulse Rate: 75 (07/24 1305)  Labs: Recent Labs    02/07/20 1650 02/07/20 1723 02/07/20 1848 02/07/20 2044 02/08/20 0427 02/08/20 1229  HGB 12.6  --   --   --  12.6  --   HCT 40.0  --   --   --  39.7  --   PLT 200  --   --   --  185  --   APTT  --   --   --  30  --   --   LABPROT  --   --   --  15.7*  --   --   INR  --   --   --  1.3*  --   --   HEPARINUNFRC  --   --   --   --  0.79* 0.70  CREATININE  --  1.78*  --   --  1.37*  --   TROPONINIHS  --  127* 127*  --  50*  --     Estimated Creatinine Clearance: 39.9 mL/min (A) (by C-G formula based on SCr of 1.37 mg/dL (H)).   Medical History: Past Medical History:  Diagnosis Date  . CAD (coronary artery disease)   . CHF (congestive heart failure) (HCC)   . PAD (peripheral artery disease) (HCC)   . Type 2 diabetes mellitus (HCC)     Medications:  Medications Prior to Admission  Medication Sig Dispense Refill Last Dose  . ASPIRIN LOW DOSE 81 MG EC tablet Take 81 mg by mouth daily.     02/10/20 atorvastatin (LIPITOR) 40 MG tablet Take 40 mg by mouth daily.     . enalapril (VASOTEC) 20 MG tablet Take 20 mg by mouth daily.     . GNP ACID REDUCER MAXIMUM ST 20 MG tablet Take 20 mg by mouth at bedtime.     . isosorbide mononitrate (IMDUR) 60 MG 24 hr tablet Take 60 mg by mouth daily.     Marland Kitchen NIFEdipine (PROCARDIA XL/NIFEDICAL-XL) 90 MG 24 hr tablet Take 90 mg by mouth daily.     Marland Kitchen PLAVIX 75 MG tablet Take 75 mg by mouth daily.     . Vitamin D, Ergocalciferol, (DRISDOL) 1.25 MG (50000 UNIT) CAPS capsule Take 50,000 Units by mouth once a week.       Assessment: Pharmacy  consulted to dose heparin in this 64 year old female with P.E.    CrCl = 30.7 ml/min ,  No prior anticoag noted  0724 0427 hl 0.79, SUPRAtherapeutic.  CBC stable.  Will decrease Heparin drip to 950 units/hr  Goal of Therapy:  Heparin level 0.3-0.7 units/ml Monitor platelets by anticoagulation protocol: Yes   Plan:  0724 @1229  HL 0.70, therapeutic at upper end of range.  CBC stable.  Will slightly decrease Heparin drip to 900 units/hr and check confirmatory HL in 6 hours.   Makinzi Prieur A 02/08/2020,1:23 PM

## 2020-02-08 NOTE — Plan of Care (Signed)

## 2020-02-08 NOTE — Progress Notes (Signed)
PHARMACY CONSULT NOTE - FOLLOW UP  Pharmacy Consult for Electrolyte Monitoring and Replacement   Recent Labs: Potassium (mmol/L)  Date Value  02/08/2020 4.2   Magnesium (mg/dL)  Date Value  80/32/1224 1.6 (L)   Calcium (mg/dL)  Date Value  82/50/0370 8.3 (L)   Albumin (g/dL)  Date Value  48/88/9169 3.4 (L)   Phosphorus (mg/dL)  Date Value  45/09/8880 2.5   Sodium (mmol/L)  Date Value  02/08/2020 138   Assessment: 64 yo female admitted with acute hypoxic respiratory failure secondary to bilateral pulmonary emboli including small saddle embolus in the distal right pulmonary artery.  Pharmacy asked to replace lytes per protocol.  Mg 1.6 this am  Goal of Therapy:  Lytes WNL, K+ >/= 4  Plan:  NP has ordered Mag 4gm for hypomagnesemia.  No other supplementation needed.  Will continue to monitor and adjust per protocol.  Wayland Denis ,PharmD Clinical Pharmacist 02/08/2020 6:13 AM

## 2020-02-08 NOTE — Progress Notes (Signed)
ANTICOAGULATION CONSULT NOTE -  Pharmacy Consult for Heparin  Indication: pulmonary embolus  No Known Allergies  Patient Measurements: Height: 5\' 6"  (167.6 cm) Weight: 67.5 kg (148 lb 13 oz) IBW/kg (Calculated) : 59.3 Heparin Dosing Weight: 65.8 kg   Vital Signs: Temp: 98.3 F (36.8 C) (07/24 0500) Temp Source: Oral (07/24 0500) BP: 135/63 (07/24 0500) Pulse Rate: 67 (07/24 0500)  Labs: Recent Labs    02/07/20 1650 02/07/20 1723 02/07/20 1848 02/07/20 2044 02/08/20 0427  HGB 12.6  --   --   --  12.6  HCT 40.0  --   --   --  39.7  PLT 200  --   --   --  185  APTT  --   --   --  30  --   LABPROT  --   --   --  15.7*  --   INR  --   --   --  1.3*  --   HEPARINUNFRC  --   --   --   --  0.79*  CREATININE  --  1.78*  --   --   --   TROPONINIHS  --  127* 127*  --   --     Estimated Creatinine Clearance: 30.7 mL/min (A) (by C-G formula based on SCr of 1.78 mg/dL (H)).   Medical History: Past Medical History:  Diagnosis Date  . CAD (coronary artery disease)   . CHF (congestive heart failure) (HCC)   . PAD (peripheral artery disease) (HCC)   . Type 2 diabetes mellitus (HCC)     Medications:  Medications Prior to Admission  Medication Sig Dispense Refill Last Dose  . ASPIRIN LOW DOSE 81 MG EC tablet Take 81 mg by mouth daily.     02/10/20 atorvastatin (LIPITOR) 40 MG tablet Take 40 mg by mouth daily.     . enalapril (VASOTEC) 20 MG tablet Take 20 mg by mouth daily.     . GNP ACID REDUCER MAXIMUM ST 20 MG tablet Take 20 mg by mouth at bedtime.     . isosorbide mononitrate (IMDUR) 60 MG 24 hr tablet Take 60 mg by mouth daily.     Marland Kitchen NIFEdipine (PROCARDIA XL/NIFEDICAL-XL) 90 MG 24 hr tablet Take 90 mg by mouth daily.     Marland Kitchen PLAVIX 75 MG tablet Take 75 mg by mouth daily.     . Vitamin D, Ergocalciferol, (DRISDOL) 1.25 MG (50000 UNIT) CAPS capsule Take 50,000 Units by mouth once a week.       Assessment: Pharmacy consulted to dose heparin in this 64 year old female with P.E.     CrCl = 30.7 ml/min ,  No prior anticoag noted  Goal of Therapy:  Heparin level 0.3-0.7 units/ml Monitor platelets by anticoagulation protocol: Yes   Plan:  Give 4000 units bolus x 1 Start heparin infusion at 1100 units/hr Check anti-Xa level in 6 hours and daily while on heparin Continue to monitor H&H and platelets   0724 0427 hl 0.79, SUPRAtherapeutic.  CBC stable.  Will decrease Heparin drip to 950 units/hr and recheck HL in 6 hours  68 A 02/08/2020,5:24 AM

## 2020-02-08 NOTE — Progress Notes (Signed)
ANTICOAGULATION CONSULT NOTE -  Pharmacy Consult for Heparin  Indication: pulmonary embolus  No Known Allergies  Patient Measurements: Height: 5\' 6"  (167.6 cm) Weight: 67.5 kg (148 lb 13 oz) IBW/kg (Calculated) : 59.3 Heparin Dosing Weight: 65.8 kg   Vital Signs: Temp: 98.5 F (36.9 C) (07/24 1600) Temp Source: Axillary (07/24 1600) BP: 103/66 (07/24 1800) Pulse Rate: 65 (07/24 1800)  Labs: Recent Labs    02/07/20 1650 02/07/20 1723 02/07/20 1848 02/07/20 2044 02/08/20 0427 02/08/20 1229 02/08/20 2056  HGB 12.6  --   --   --  12.6  --   --   HCT 40.0  --   --   --  39.7  --   --   PLT 200  --   --   --  185  --   --   APTT  --   --   --  30  --   --   --   LABPROT  --   --   --  15.7*  --   --   --   INR  --   --   --  1.3*  --   --   --   HEPARINUNFRC  --   --   --   --  0.79* 0.70 0.65  CREATININE  --  1.78*  --   --  1.37*  --   --   TROPONINIHS  --  127* 127*  --  50*  --   --     Estimated Creatinine Clearance: 39.9 mL/min (A) (by C-G formula based on SCr of 1.37 mg/dL (H)).   Medical History: Past Medical History:  Diagnosis Date  . CAD (coronary artery disease)   . CHF (congestive heart failure) (HCC)   . PAD (peripheral artery disease) (HCC)   . Type 2 diabetes mellitus (HCC)     Medications:  Medications Prior to Admission  Medication Sig Dispense Refill Last Dose  . ASPIRIN LOW DOSE 81 MG EC tablet Take 81 mg by mouth daily.     02/10/20 atorvastatin (LIPITOR) 40 MG tablet Take 40 mg by mouth daily.     . enalapril (VASOTEC) 20 MG tablet Take 20 mg by mouth daily.     . GNP ACID REDUCER MAXIMUM ST 20 MG tablet Take 20 mg by mouth at bedtime.     . isosorbide mononitrate (IMDUR) 60 MG 24 hr tablet Take 60 mg by mouth daily.     Marland Kitchen NIFEdipine (PROCARDIA XL/NIFEDICAL-XL) 90 MG 24 hr tablet Take 90 mg by mouth daily.     Marland Kitchen PLAVIX 75 MG tablet Take 75 mg by mouth daily.     . Vitamin D, Ergocalciferol, (DRISDOL) 1.25 MG (50000 UNIT) CAPS capsule Take 50,000  Units by mouth once a week.       Assessment: Pharmacy consulted to dose heparin in this 64 year old female with P.E.    CrCl = 30.7 ml/min ,  No prior anticoag noted  0724 0427 HL 0.79 SUPRAtherapeutic - rate decr to 950 units/hr 0724 @1229  HL 0.70 therapeutic (upper range) - rate decr to 900 units/hr 0724 @ 2056 HL 0.65 therapeutic  Goal of Therapy:  Heparin level 0.3-0.7 units/ml Monitor platelets by anticoagulation protocol: Yes   Plan:  Will check HL/CBC with am labs   68, PharmD, BCPS Clinical Pharmacist 02/08/2020 10:00 PM

## 2020-02-08 NOTE — ED Notes (Addendum)
Pt's daughter Merlyn Bollen updated by this RN with permission from pt.  Pt placed on bedpan, able to urinate. Pt updated on transfer to ICU.

## 2020-02-09 LAB — CBC WITH DIFFERENTIAL/PLATELET
Abs Immature Granulocytes: 0.04 10*3/uL (ref 0.00–0.07)
Basophils Absolute: 0 10*3/uL (ref 0.0–0.1)
Basophils Relative: 0 %
Eosinophils Absolute: 0.3 10*3/uL (ref 0.0–0.5)
Eosinophils Relative: 3 %
HCT: 38.3 % (ref 36.0–46.0)
Hemoglobin: 12 g/dL (ref 12.0–15.0)
Immature Granulocytes: 0 %
Lymphocytes Relative: 9 %
Lymphs Abs: 0.9 10*3/uL (ref 0.7–4.0)
MCH: 28.3 pg (ref 26.0–34.0)
MCHC: 31.3 g/dL (ref 30.0–36.0)
MCV: 90.3 fL (ref 80.0–100.0)
Monocytes Absolute: 0.6 10*3/uL (ref 0.1–1.0)
Monocytes Relative: 7 %
Neutro Abs: 7.7 10*3/uL (ref 1.7–7.7)
Neutrophils Relative %: 81 %
Platelets: 186 10*3/uL (ref 150–400)
RBC: 4.24 MIL/uL (ref 3.87–5.11)
RDW: 15 % (ref 11.5–15.5)
WBC: 9.5 10*3/uL (ref 4.0–10.5)
nRBC: 0 % (ref 0.0–0.2)

## 2020-02-09 LAB — GLUCOSE, CAPILLARY
Glucose-Capillary: 86 mg/dL (ref 70–99)
Glucose-Capillary: 112 mg/dL — ABNORMAL HIGH (ref 70–99)
Glucose-Capillary: 60 mg/dL — ABNORMAL LOW (ref 70–99)
Glucose-Capillary: 78 mg/dL (ref 70–99)
Glucose-Capillary: 88 mg/dL (ref 70–99)

## 2020-02-09 LAB — BASIC METABOLIC PANEL
Anion gap: 7 (ref 5–15)
BUN: 18 mg/dL (ref 8–23)
CO2: 24 mmol/L (ref 22–32)
Calcium: 8.2 mg/dL — ABNORMAL LOW (ref 8.9–10.3)
Chloride: 105 mmol/L (ref 98–111)
Creatinine, Ser: 1.1 mg/dL — ABNORMAL HIGH (ref 0.44–1.00)
GFR calc Af Amer: >60 mL/min (ref >60)
GFR calc non Af Amer: 54 mL/min — ABNORMAL LOW (ref >60)
Glucose, Bld: 94 mg/dL (ref 70–99)
Potassium: 4.1 mmol/L (ref 3.5–5.1)
Sodium: 136 mmol/L (ref 135–145)

## 2020-02-09 LAB — HEPARIN LEVEL (UNFRACTIONATED): Heparin Unfractionated: 0.45 IU/mL (ref 0.30–0.70)

## 2020-02-09 LAB — PHOSPHORUS: Phosphorus: 2.6 mg/dL (ref 2.5–4.6)

## 2020-02-09 LAB — MAGNESIUM: Magnesium: 2 mg/dL (ref 1.7–2.4)

## 2020-02-09 MED ORDER — FAMOTIDINE 20 MG PO TABS
20.0000 mg | ORAL_TABLET | Freq: Every day | ORAL | Status: DC
  Administered 2020-02-09 – 2020-02-13 (×4): 20 mg via ORAL
  Filled 2020-02-09 (×4): qty 1

## 2020-02-09 MED ORDER — CARBAMIDE PEROXIDE 6.5 % OT SOLN
10.0000 [drp] | Freq: Two times a day (BID) | OTIC | Status: DC
  Administered 2020-02-09 – 2020-02-13 (×9): 10 [drp] via OTIC
  Filled 2020-02-09: qty 15

## 2020-02-09 NOTE — Progress Notes (Signed)
ANTICOAGULATION CONSULT NOTE -  Pharmacy Consult for Heparin  Indication: pulmonary embolus  No Known Allergies  Patient Measurements: Height: 5\' 6"  (167.6 cm) Weight: 67.5 kg (148 lb 13 oz) IBW/kg (Calculated) : 59.3 Heparin Dosing Weight: 65.8 kg   Vital Signs: Temp: 98.5 F (36.9 C) (07/25 0200) Temp Source: Oral (07/25 0200) BP: 134/84 (07/25 0400) Pulse Rate: 61 (07/25 0400)  Labs: Recent Labs    02/07/20 1650 02/07/20 1650 02/07/20 1723 02/07/20 1848 02/07/20 2044 02/08/20 0427 02/08/20 0427 02/08/20 1229 02/08/20 2056 02/09/20 0454  HGB 12.6   < >  --   --   --  12.6  --   --   --  12.0  HCT 40.0  --   --   --   --  39.7  --   --   --  38.3  PLT 200  --   --   --   --  185  --   --   --  186  APTT  --   --   --   --  30  --   --   --   --   --   LABPROT  --   --   --   --  15.7*  --   --   --   --   --   INR  --   --   --   --  1.3*  --   --   --   --   --   HEPARINUNFRC  --   --   --   --   --  0.79*   < > 0.70 0.65 0.45  CREATININE  --   --  1.78*  --   --  1.37*  --   --   --  1.10*  TROPONINIHS  --   --  127* 127*  --  50*  --   --   --   --    < > = values in this interval not displayed.    Estimated Creatinine Clearance: 49.6 mL/min (A) (by C-G formula based on SCr of 1.1 mg/dL (H)).   Medical History: Past Medical History:  Diagnosis Date  . CAD (coronary artery disease)   . CHF (congestive heart failure) (HCC)   . PAD (peripheral artery disease) (HCC)   . Type 2 diabetes mellitus (HCC)     Medications:  Medications Prior to Admission  Medication Sig Dispense Refill Last Dose  . ASPIRIN LOW DOSE 81 MG EC tablet Take 81 mg by mouth daily.     02/11/20 atorvastatin (LIPITOR) 40 MG tablet Take 40 mg by mouth daily.     . enalapril (VASOTEC) 20 MG tablet Take 20 mg by mouth daily.     . GNP ACID REDUCER MAXIMUM ST 20 MG tablet Take 20 mg by mouth at bedtime.     . isosorbide mononitrate (IMDUR) 60 MG 24 hr tablet Take 60 mg by mouth daily.     Marland Kitchen  NIFEdipine (PROCARDIA XL/NIFEDICAL-XL) 90 MG 24 hr tablet Take 90 mg by mouth daily.     Marland Kitchen PLAVIX 75 MG tablet Take 75 mg by mouth daily.     . Vitamin D, Ergocalciferol, (DRISDOL) 1.25 MG (50000 UNIT) CAPS capsule Take 50,000 Units by mouth once a week.       Assessment: Pharmacy consulted to dose heparin in this 64 year old female with P.E.    CrCl = 30.7 ml/min ,  No  prior anticoag noted  0724 0427 HL 0.79 SUPRAtherapeutic - rate decr to 950 units/hr 0724 @1229  HL 0.70 therapeutic (upper range) - rate decr to 900 units/hr 0724 @ 2056 HL 0.65 therapeutic 0725 0454 HL 0.45, therapeutic x 2, CBC stable  Goal of Therapy:  Heparin level 0.3-0.7 units/ml Monitor platelets by anticoagulation protocol: Yes   Plan:  Continue Heparin at current rate.  Recheck HL and CBC in am  Clinical Pharmacist 02/09/2020 6:33 AM

## 2020-02-09 NOTE — Progress Notes (Signed)
PHARMACY CONSULT NOTE - FOLLOW UP  Pharmacy Consult for Electrolyte Monitoring and Replacement   Recent Labs: Potassium (mmol/L)  Date Value  02/09/2020 4.1   Magnesium (mg/dL)  Date Value  27/01/8674 2.0   Calcium (mg/dL)  Date Value  44/92/0100 8.2 (L)   Albumin (g/dL)  Date Value  71/21/9758 3.4 (L)   Phosphorus (mg/dL)  Date Value  83/25/4982 2.6   Sodium (mmol/L)  Date Value  02/09/2020 136   Assessment: 64 yo female admitted with acute hypoxic respiratory failure secondary to bilateral pulmonary emboli including small saddle embolus in the distal right pulmonary artery.  Pharmacy asked to replace lytes per protocol.  Mg 1.6 this am  Goal of Therapy:  Lytes WNL, K+ >/= 4  Plan:   No supplementation at this time.  Will continue to monitor and adjust per protocol.  Angelique Blonder ,PharmD Clinical Pharmacist 02/09/2020 12:12 PM

## 2020-02-09 NOTE — Progress Notes (Signed)
Name: Megan James MRN: [id number] DOB: 27-Nov-1955    ADMISSION DATE:  02/07/2020 CONSULTATION DATE: 02/08/2020  REFERRING MD : Dr. Scotty Court   CHIEF COMPLAINT: Shortness of Breath   BRIEF PATIENT DESCRIPTION:  64 yo female admitted with acute hypoxic respiratory failure secondary to bilateral pulmonary emboli including small saddle embolus in the distal right pulmonary artery  SIGNIFICANT EVENTS/STUDIES:  07/23: Pt presented to [facility] ER with shortness of breath  07/23: CTA Chest positive for acute right greater than left bilateral pulmonary emboli including small saddle embolus in the distal right pulmonary artery. Elevation of RV LV ratio at 1.0, suspect for component of right heart strain, however there are other features such as dilatation of the pulmonary trunk and cardiomegaly suggesting underlying chronic disease. Subsegmental atelectasis at the lower lobes.  Mild emphysema Aortic Atherosclerosis (ICD10-I70.0) and Emphysema (ICD10-J43.9). 07/23: ER physician contacted [facility] PCCM team for possible pt transfer for [facility] intervention and pt accepted and placed on transfer waiting list  07/24: Pt became hypoxic requiring NRB while in the ER pending transfer to [facility] contacted by [facility] physician Dr. Warrick Parisian for possible ICU admission at [facility].  Pt admitted to [facility] ICU  7/25 -patient reported pain and has asked for her home methadone, we worked with pharmacy and they were gracious enough to speak with [place] clinic and discovered patient actually takes very high dose of methadone 150mg  daily.    HISTORY OF PRESENT ILLNESS:   This is a 64 yo female with a PMH of Type II Diabetes Mellitus, PAD s/p femoral stents, CHF, and CAD.  She presented to [facility] ER on 07/23 via EMS with c/o worsening shortness of breath and right lower chest pain onset of symptoms 3 days prior to ER physician.  She also [place] recent travel [place]. Upon arrival to the ER pts O2 sats  on RA were in the 70's, therefore she was placed on 6L via nasal canula with O2 sats increasing to 94 to 96%.  Lab results revealed glucose 123, BUN 26, creatinine 1.78, calcium 8.6, BNP 299.2, and troponin 127.  COVID-19 negative, however CXR revealed streaky bibasilar opacities favoring atelectasis.  CTA chest revealed bilateral pulmonary emboli with small saddle embolus along with possible component of right heart strain. She was subsequently admitted to ICU per PCCM team.  Detailed hospital course outlined above under significant events/studies.    PAST MEDICAL HISTORY :   has a past medical history of CAD (coronary artery disease), CHF (congestive heart failure) (HCC), PAD (peripheral artery disease) (HCC), and Type 2 diabetes mellitus (HCC).  has a past surgical history that includes Femoral artery stent and Coronary stent placement. Prior to Admission medications   Medication Sig Start Date End Date Taking? Authorizing Provider  ASPIRIN LOW DOSE 81 MG EC tablet Take 81 mg by mouth daily. 01/21/20  Yes [provider]  atorvastatin (LIPITOR) 40 MG tablet Take 40 mg by mouth daily. 01/21/20  Yes [provider]  enalapril (VASOTEC) 20 MG tablet Take 20 mg by mouth daily. 01/21/20  Yes [provider]  GNP ACID REDUCER MAXIMUM ST 20 MG tablet Take 20 mg by mouth at bedtime. 01/21/20  Yes [provider]  isosorbide mononitrate (IMDUR) 60 MG 24 hr tablet Take 60 mg by mouth daily. 01/21/20  Yes [provider]  NIFEdipine (PROCARDIA XL/NIFEDICAL-XL) 90 MG 24 hr tablet Take 90 mg by mouth daily. 01/21/20  Yes [provider]  PLAVIX  75 MG tablet Take 75 mg by mouth daily. 01/22/20  Yes [provider]  Vitamin D, Ergocalciferol, (DRISDOL) 1.25 MG (50000 UNIT) CAPS capsule Take 50,000 Units by mouth once a week. 10/24/19  Yes [provider]   No Known Allergies  FAMILY HISTORY:  family history is not on file. SOCIAL HISTORY:  reports  previous drug use.  REVIEW OF SYSTEMS: Positives in BOLD  Constitutional: Negative for fever, chills, weight loss, malaise/fatigue and diaphoresis.  HENT: Negative for hearing loss, ear pain, nosebleeds, congestion, sore throat, neck pain, tinnitus and ear discharge.   Eyes: Negative for blurred vision, double vision, photophobia, pain, discharge and redness.  Respiratory: cough, hemoptysis, sputum production, shortness of breath, wheezing and stridor.   Cardiovascular: chest pain, palpitations, orthopnea, claudication, leg swelling and PND.  Gastrointestinal: Negative for heartburn, nausea, vomiting, abdominal pain, diarrhea, constipation, blood in stool and melena.  Genitourinary: Negative for dysuria, urgency, frequency, hematuria and flank pain.  Musculoskeletal: Negative for myalgias, back pain, joint pain and falls.  Skin: Negative for itching and rash.  Neurological: Negative for dizziness, tingling, tremors, sensory change, speech change, focal weakness, seizures, loss of consciousness, weakness and headaches.  Endo/Heme/Allergies: Negative for environmental allergies and polydipsia. Does not bruise/bleed easily.  SUBJECTIVE:  States shortness of breath and chest discomfort has improved   VITAL SIGNS: Temp:  [98.5 F (36.9 C)-98.8 F (37.1 C)] 98.8 F (37.1 C) (07/25 0800) Pulse Rate:  [54-75] 55 (07/25 0800) Resp:  [14-27] 21 (07/25 0800) BP: (96-137)/(56-84) 129/71 (07/25 0800) SpO2:  [87 %-99 %] 93 % (07/25 0800) Weight:  [67.5 kg] 67.5 kg (07/25 0500)  PHYSICAL EXAMINATION: General: acutely ill appearing female resting in bed, NAD with NRB in place  Neuro: alert and oriented, follows commands  HEENT: supple, no JVD  Cardiovascular: nsr, rrr, no R/G Lungs: diminished throughout, even, non labored Abdomen: +BS x4, soft, non tender, non distended  Musculoskeletal: normal bulk and tone, no edema  Skin: intact no rashes or lesions present   Recent Labs  Lab  02/07/20 1723 02/08/20 0427 02/09/20 0454  NA 136 138 136  K 3.9 4.2 4.1  CL 101 105 105  CO2 24 24 24   BUN 26* 23 18  CREATININE 1.78* 1.37* 1.10*  GLUCOSE 123* 82 94   Recent Labs  Lab 02/07/20 1650 02/08/20 0427 02/09/20 0454  HGB 12.6 12.6 12.0  HCT 40.0 39.7 38.3  WBC 13.4* 12.7* 9.5  PLT 200 185 186   CT Angio Chest PE W and/or Wo Contrast  Result Date: 02/07/2020 CLINICAL DATA:  Shortness of breath EXAM: CT ANGIOGRAPHY CHEST WITH CONTRAST TECHNIQUE: Multidetector CT imaging of the chest was performed using the standard protocol during bolus administration of intravenous contrast. Multiplanar CT image reconstructions and MIPs were obtained to evaluate the vascular anatomy. CONTRAST:  33mL OMNIPAQUE IOHEXOL 350 MG/ML SOLN COMPARISON:  Chest x-ray 02/07/2020 FINDINGS: Cardiovascular: Satisfactory opacification of the pulmonary arteries to the segmental level. Small saddle embolus within the distal right pulmonary artery. Acute nonocclusive embolus within right descending pulmonary artery with extension of thrombus into right upper and lower lobe segmental and subsegmental vessels. Small amount of right middle lobe segmental and subsegmental thrombus. Acute left lower lobe segmental and subsegmental emboli. RV LV ratio slightly elevated at 1.0. Dilated pulmonary trunk up to 4 cm. Ectatic ascending aorta without aneurysm. Mild aortic atherosclerosis. Coronary vascular calcification. Cardiomegaly with no significant pericardial effusion Mediastinum/Nodes: Midline trachea. No thyroid mass. No suspicious nodes. Lungs/Pleura: Mild emphysema. No  consolidation or pleural effusion. Subsegmental atelectasis in the lower lobes. No pneumothorax. Upper Abdomen: No acute abnormality. Musculoskeletal: No chest wall abnormality. No acute or significant osseous findings. Review of the MIP images confirms the above findings. IMPRESSION: 1. Positive for acute right greater than left bilateral pulmonary  emboli including small saddle embolus in the distal right pulmonary artery. Elevation of RV LV ratio at 1.0, suspect for component of right heart strain, however there are other features such as dilatation of the pulmonary trunk and cardiomegaly suggesting underlying chronic disease. 2. Subsegmental atelectasis at the lower lobes.  Mild emphysema Aortic Atherosclerosis (ICD10-I70.0) and Emphysema (ICD10-J43.9). Critical Value/emergent results were called by telephone at the time of interpretation on 02/07/2020 at 8:24 pm to provider PHILLIP STAFFORD , who verbally acknowledged these results. Electronically Signed   By: Jasmine Pang M.D.   On: 02/07/2020 20:24   DG Chest Portable 1 View  Result Date: 02/07/2020 CLINICAL DATA:  Shortness of breath with hypoxia EXAM: PORTABLE CHEST 1 VIEW COMPARISON:  None. FINDINGS: Streaky bibasilar opacities. No pleural effusion. Borderline to mild cardiomegaly with aortic atherosclerosis. No pneumothorax. IMPRESSION: Streaky bibasilar opacities, favor atelectasis or scarring over atypical infiltrates. Electronically Signed   By: Jasmine Pang M.D.   On: 02/07/2020 17:29    ASSESSMENT / PLAN:  Acute hypoxic respiratory failure secondary to bilateral pulmonary emboli  including small saddle embolus in the distal right pulmonary artery and atelectasis  Elevated troponin likely demand ischemia secondary to bilateral pulmonary emboli Hx: CAD  Supplemental O2 for dyspnea and/or hypoxia  Continue heparin gtt dosing per pharmacy  Will hold outpatient antihypertensives for now  Continue outpatient atorvastatin  Echo pending  Trend troponin's  Vascular plan for thrombectomy Monday 02/10/20 Fibrinogen and repeat troponin pending  Trend CBC Monitor for s/sx of bleeding and transfuse for hgb <7 Aggressive pulmonary hygiene    Chronic opioid use Patient takes methadone 150mg  daily   Acute renal failure  Trend BMP  Replace electrolytes as indicated Monitor UOP   Avoid nephrotoxic medications   Type II Diabetes Mellitus CBG's ac/hs  SSI    , M.D.  Pulmonary & Critical Care Medicine  [facility]

## 2020-02-09 NOTE — Plan of Care (Signed)

## 2020-02-09 NOTE — Progress Notes (Signed)
Subjective/Chief Complaint: Improved. Now on Nasal canula. Hemodynamically stable.   Objective: Vital signs in last 24 hours: Temp:  [98.5 F (36.9 C)-98.8 F (37.1 C)] 98.8 F (37.1 C) (07/25 0800) Pulse Rate:  [54-75] 59 (07/25 1000) Resp:  [16-27] 22 (07/25 1000) BP: (96-137)/(56-84) 133/74 (07/25 1000) SpO2:  [87 %-99 %] 94 % (07/25 1000) Weight:  [67.5 kg] 67.5 kg (07/25 0500) Last BM Date: 02/06/20  Intake/Output from previous day: 07/24 0701 - 07/25 0700 In: 924.8 [P.O.:600; I.V.:219.2; IV Piggyback:105.6] Out: 450 [Urine:450] Intake/Output this shift: Total I/O In: 25 [I.V.:25] Out: -   General appearance: alert and mild distress Resp: clear to auscultation bilaterally Cardio: regular rate and rhythm Extremities: No edema, warm  Lab Results:  Recent Labs    02/08/20 0427 02/09/20 0454  WBC 12.7* 9.5  HGB 12.6 12.0  HCT 39.7 38.3  PLT 185 186   BMET Recent Labs    02/08/20 0427 02/09/20 0454  NA 138 136  K 4.2 4.1  CL 105 105  CO2 24 24  GLUCOSE 82 94  BUN 23 18  CREATININE 1.37* 1.10*  CALCIUM 8.3* 8.2*   PT/INR Recent Labs    02/07/20 2044  LABPROT 15.7*  INR 1.3*   ABG Recent Labs    02/08/20 1010  PHART 7.37  HCO3 23.1    Studies/Results: CT Angio Chest PE W and/or Wo Contrast  Result Date: 02/07/2020 CLINICAL DATA:  Shortness of breath EXAM: CT ANGIOGRAPHY CHEST WITH CONTRAST TECHNIQUE: Multidetector CT imaging of the chest was performed using the standard protocol during bolus administration of intravenous contrast. Multiplanar CT image reconstructions and MIPs were obtained to evaluate the vascular anatomy. CONTRAST:  65mL OMNIPAQUE IOHEXOL 350 MG/ML SOLN COMPARISON:  Chest x-ray 02/07/2020 FINDINGS: Cardiovascular: Satisfactory opacification of the pulmonary arteries to the segmental level. Small saddle embolus within the distal right pulmonary artery. Acute nonocclusive embolus within right descending pulmonary artery with  extension of thrombus into right upper and lower lobe segmental and subsegmental vessels. Small amount of right middle lobe segmental and subsegmental thrombus. Acute left lower lobe segmental and subsegmental emboli. RV LV ratio slightly elevated at 1.0. Dilated pulmonary trunk up to 4 cm. Ectatic ascending aorta without aneurysm. Mild aortic atherosclerosis. Coronary vascular calcification. Cardiomegaly with no significant pericardial effusion Mediastinum/Nodes: Midline trachea. No thyroid mass. No suspicious nodes. Lungs/Pleura: Mild emphysema. No consolidation or pleural effusion. Subsegmental atelectasis in the lower lobes. No pneumothorax. Upper Abdomen: No acute abnormality. Musculoskeletal: No chest wall abnormality. No acute or significant osseous findings. Review of the MIP images confirms the above findings. IMPRESSION: 1. Positive for acute right greater than left bilateral pulmonary emboli including small saddle embolus in the distal right pulmonary artery. Elevation of RV LV ratio at 1.0, suspect for component of right heart strain, however there are other features such as dilatation of the pulmonary trunk and cardiomegaly suggesting underlying chronic disease. 2. Subsegmental atelectasis at the lower lobes.  Mild emphysema Aortic Atherosclerosis (ICD10-I70.0) and Emphysema (ICD10-J43.9). Critical Value/emergent results were called by telephone at the time of interpretation on 02/07/2020 at 8:24 pm to provider PHILLIP STAFFORD , who verbally acknowledged these results. Electronically Signed   By: Jasmine Pang M.D.   On: 02/07/2020 20:24   DG Chest Portable 1 View  Result Date: 02/07/2020 CLINICAL DATA:  Shortness of breath with hypoxia EXAM: PORTABLE CHEST 1 VIEW COMPARISON:  None. FINDINGS: Streaky bibasilar opacities. No pleural effusion. Borderline to mild cardiomegaly with aortic atherosclerosis. No pneumothorax. IMPRESSION:  Streaky bibasilar opacities, favor atelectasis or scarring over  atypical infiltrates. Electronically Signed   By: Jasmine Pang M.D.   On: 02/07/2020 17:29    Anti-infectives: Anti-infectives (From admission, onward)   None      Assessment/Plan:  Bilateral Pulmonary emboli/saddle PE  Plan for Pulmonary Thrombectomy tomorrow  Continue Heparin gtt and supportive care NPO after MN  LOS: 2 days    Eli Hose A 02/09/2020

## 2020-02-09 NOTE — Progress Notes (Addendum)
Shift summary:  - Patient remains on 8L HFNC. - Remains on Heparin infusion. - Planning for thrombectomy tomorrow.  - Report given to 2A RN, Lillia Abed.

## 2020-02-10 ENCOUNTER — Encounter
Admission: EM | Disposition: A | Discharge status: Home / Self Care | Payer: Self-pay | Source: Home / Self Care | Attending: Pulmonary Disease

## 2020-02-10 ENCOUNTER — Encounter: Payer: Self-pay | Admitting: Pulmonary Disease

## 2020-02-10 ENCOUNTER — Other Ambulatory Visit (INDEPENDENT_AMBULATORY_CARE_PROVIDER_SITE_OTHER): Payer: Self-pay | Admitting: Vascular Surgery

## 2020-02-10 DIAGNOSIS — I2699 Other pulmonary embolism without acute cor pulmonale: Secondary | ICD-10-CM

## 2020-02-10 HISTORY — PX: PULMONARY THROMBECTOMY: CATH118295

## 2020-02-10 LAB — BASIC METABOLIC PANEL
Anion gap: 5 (ref 5–15)
BUN: 14 mg/dL (ref 8–23)
CO2: 27 mmol/L (ref 22–32)
Calcium: 8.6 mg/dL — ABNORMAL LOW (ref 8.9–10.3)
Chloride: 106 mmol/L (ref 98–111)
Creatinine, Ser: 1.13 mg/dL — ABNORMAL HIGH (ref 0.44–1.00)
GFR calc Af Amer: >60 mL/min (ref >60)
GFR calc non Af Amer: 52 mL/min — ABNORMAL LOW (ref >60)
Glucose, Bld: 75 mg/dL (ref 70–99)
Potassium: 4.2 mmol/L (ref 3.5–5.1)
Sodium: 138 mmol/L (ref 135–145)

## 2020-02-10 LAB — CBC WITH DIFFERENTIAL/PLATELET
Abs Immature Granulocytes: 0.05 10*3/uL (ref 0.00–0.07)
Basophils Absolute: 0 10*3/uL (ref 0.0–0.1)
Basophils Relative: 0 %
Eosinophils Absolute: 0.2 10*3/uL (ref 0.0–0.5)
Eosinophils Relative: 2 %
HCT: 37.8 % (ref 36.0–46.0)
Hemoglobin: 12.3 g/dL (ref 12.0–15.0)
Immature Granulocytes: 1 %
Lymphocytes Relative: 20 %
Lymphs Abs: 1.9 10*3/uL (ref 0.7–4.0)
MCH: 28.4 pg (ref 26.0–34.0)
MCHC: 32.5 g/dL (ref 30.0–36.0)
MCV: 87.3 fL (ref 80.0–100.0)
Monocytes Absolute: 0.8 10*3/uL (ref 0.1–1.0)
Monocytes Relative: 8 %
Neutro Abs: 6.7 10*3/uL (ref 1.7–7.7)
Neutrophils Relative %: 69 %
Platelets: 218 10*3/uL (ref 150–400)
RBC: 4.33 MIL/uL (ref 3.87–5.11)
RDW: 15 % (ref 11.5–15.5)
WBC: 9.7 10*3/uL (ref 4.0–10.5)
nRBC: 0 % (ref 0.0–0.2)

## 2020-02-10 LAB — GLUCOSE, CAPILLARY
Glucose-Capillary: 65 mg/dL — ABNORMAL LOW (ref 70–99)
Glucose-Capillary: 43 mg/dL — CL (ref 70–99)
Glucose-Capillary: 216 mg/dL — ABNORMAL HIGH (ref 70–99)
Glucose-Capillary: 77 mg/dL (ref 70–99)
Glucose-Capillary: 86 mg/dL (ref 70–99)
Glucose-Capillary: 116 mg/dL — ABNORMAL HIGH (ref 70–99)

## 2020-02-10 LAB — HEPARIN LEVEL (UNFRACTIONATED): Heparin Unfractionated: 0.41 IU/mL (ref 0.30–0.70)

## 2020-02-10 LAB — MAGNESIUM: Magnesium: 1.5 mg/dL — ABNORMAL LOW (ref 1.7–2.4)

## 2020-02-10 LAB — PHOSPHORUS: Phosphorus: 2.5 mg/dL (ref 2.5–4.6)

## 2020-02-10 SURGERY — PULMONARY THROMBECTOMY
Anesthesia: Moderate Sedation | Laterality: Bilateral

## 2020-02-10 MED ORDER — METHADONE HCL 10 MG PO TABS
100.0000 mg | ORAL_TABLET | Freq: Every day | ORAL | Status: DC
  Administered 2020-02-10 – 2020-02-13 (×4): 100 mg via ORAL
  Filled 2020-02-10 (×3): qty 10

## 2020-02-10 MED ORDER — MIDAZOLAM HCL 5 MG/5ML IJ SOLN
INTRAMUSCULAR | Status: AC
  Filled 2020-02-10: qty 5

## 2020-02-10 MED ORDER — ALTEPLASE 2 MG IJ SOLR
INTRAMUSCULAR | Status: DC | PRN
  Administered 2020-02-10: 2 mg
  Administered 2020-02-10: 6 mg

## 2020-02-10 MED ORDER — MAGNESIUM SULFATE 2 GM/50ML IV SOLN
2.0000 g | Freq: Once | INTRAVENOUS | Status: AC
  Administered 2020-02-10: 2 g via INTRAVENOUS
  Filled 2020-02-10: qty 50

## 2020-02-10 MED ORDER — MIDAZOLAM HCL 2 MG/ML PO SYRP
8.0000 mg | ORAL_SOLUTION | Freq: Once | ORAL | Status: DC | PRN
  Filled 2020-02-10: qty 4

## 2020-02-10 MED ORDER — METHADONE HCL 10 MG PO TABS
100.0000 mg | ORAL_TABLET | Freq: Every day | ORAL | Status: DC
  Filled 2020-02-10 (×2): qty 10

## 2020-02-10 MED ORDER — CEFAZOLIN SODIUM-DEXTROSE 2-4 GM/100ML-% IV SOLN
INTRAVENOUS | Status: AC
  Filled 2020-02-10: qty 100

## 2020-02-10 MED ORDER — ALTEPLASE 2 MG IJ SOLR
INTRAMUSCULAR | Status: AC
  Filled 2020-02-10: qty 8

## 2020-02-10 MED ORDER — ONDANSETRON HCL 4 MG/2ML IJ SOLN: 4.0000 mg | Freq: Four times a day (QID) | INTRAMUSCULAR | Status: DC | PRN

## 2020-02-10 MED ORDER — HEPARIN SODIUM (PORCINE) 1000 UNIT/ML IJ SOLN
INTRAMUSCULAR | Status: AC
  Filled 2020-02-10: qty 1

## 2020-02-10 MED ORDER — SODIUM CHLORIDE 0.9 % IV SOLN: INTRAVENOUS | Status: DC

## 2020-02-10 MED ORDER — FENTANYL CITRATE (PF) 100 MCG/2ML IJ SOLN
INTRAMUSCULAR | Status: DC | PRN
  Administered 2020-02-10: 50 ug via INTRAVENOUS
  Administered 2020-02-10: 25 ug via INTRAVENOUS

## 2020-02-10 MED ORDER — DEXTROSE 50 % IV SOLN
INTRAVENOUS | Status: AC
  Administered 2020-02-10: 50 mL
  Filled 2020-02-10: qty 50

## 2020-02-10 MED ORDER — METHYLPREDNISOLONE SODIUM SUCC 125 MG IJ SOLR: 125.0000 mg | Freq: Once | INTRAMUSCULAR | Status: DC | PRN

## 2020-02-10 MED ORDER — MIDAZOLAM HCL 2 MG/2ML IJ SOLN
INTRAMUSCULAR | Status: DC | PRN
  Administered 2020-02-10: 1 mg via INTRAVENOUS
  Administered 2020-02-10: 2 mg via INTRAVENOUS

## 2020-02-10 MED ORDER — HEPARIN SODIUM (PORCINE) 1000 UNIT/ML IJ SOLN
INTRAMUSCULAR | Status: DC | PRN
  Administered 2020-02-10: 3000 [IU] via INTRAVENOUS

## 2020-02-10 MED ORDER — DIPHENHYDRAMINE HCL 50 MG/ML IJ SOLN: 50.0000 mg | Freq: Once | INTRAMUSCULAR | Status: DC | PRN

## 2020-02-10 MED ORDER — HYDROMORPHONE HCL 1 MG/ML IJ SOLN: 1.0000 mg | Freq: Once | INTRAMUSCULAR | Status: DC | PRN

## 2020-02-10 MED ORDER — CEFAZOLIN SODIUM-DEXTROSE 2-4 GM/100ML-% IV SOLN
2.0000 g | Freq: Once | INTRAVENOUS | Status: AC
  Filled 2020-02-10: qty 100

## 2020-02-10 MED ORDER — FAMOTIDINE 20 MG PO TABS: 40.0000 mg | ORAL_TABLET | Freq: Once | ORAL | Status: DC | PRN

## 2020-02-10 MED ORDER — DEXTROSE IN LACTATED RINGERS 5 % IV SOLN: INTRAVENOUS | Status: DC

## 2020-02-10 MED ORDER — METHADONE HCL 10 MG/ML PO CONC: 150.0000 mg | Freq: Every day | ORAL | Status: DC

## 2020-02-10 MED ORDER — FENTANYL CITRATE (PF) 100 MCG/2ML IJ SOLN
INTRAMUSCULAR | Status: AC
  Filled 2020-02-10: qty 2

## 2020-02-10 SURGICAL SUPPLY — 13 items
CANISTER PENUMBRA ENGINE (MISCELLANEOUS) ×2 IMPLANT
CATH ANGIO 5F PIGTAIL 100CM (CATHETERS) ×2 IMPLANT
CATH INDIGO 12XTORQ 100 (CATHETERS) ×2 IMPLANT
CATH INFINITI JR4 5F (CATHETERS) ×2 IMPLANT
CATH SELECT BERN TIP 5F 130 (CATHETERS) ×2 IMPLANT
GLIDEWIRE ADV .035X260CM (WIRE) ×2 IMPLANT
PACK ANGIOGRAPHY (CUSTOM PROCEDURE TRAY) ×3 IMPLANT
SHEATH BRITE TIP 8FRX11 (SHEATH) ×2 IMPLANT
SHEATH PINNACLE 11FRX10 (SHEATH) ×2 IMPLANT
SYR MEDRAD MARK 7 150ML (SYRINGE) ×2 IMPLANT
TUBING CONTRAST HIGH PRESS 72 (TUBING) ×3 IMPLANT
WIRE J 3MM .035X145CM (WIRE) ×3 IMPLANT
WIRE MAGIC TORQUE 260C (WIRE) ×2 IMPLANT

## 2020-02-10 NOTE — Progress Notes (Signed)
Date: 02/10/2020,   MRN# [id number] Megan James 1956/05/24 Code Status:     Code Status Orders  (From admission, onward)         Start     Ordered   02/08/20 0310  Full code  Continuous        02/08/20 0313        Code Status History    Date Active Date Inactive Code Status Order ID Comments User Context   02/07/2020 2040 02/08/2020 0313 Full Code [id number]  Andris Baumann, MD ED   Advance Care Planning Activity     Hosp day:@LENGTHOFSTAYDAYS @ Referring MD: @ATDPROV @     PCP:      A Date: 02/10/2020,   MRN# 02/12/2020 Megan James 11-Jan-1956 Code Status:     Code Status Orders  (From admission, onward)         Start     Ordered   02/08/20 0310  Full code  Continuous        02/08/20 0313        Code Status History    Date Active Date Inactive Code Status Order ID Comments User Context   02/07/2020 2040 02/08/2020 0313 Full Code 02/10/2020  [id number], MD ED   Advance Care Planning Activity     Hosp day:@LENGTHOFSTAYDAYS @ Referring MD: @ATDPROV @     PCP:       HPI: pulm emboli, s/p mechanical embilectomy. Tolerated well and breathing easier  PMHX:   Past Medical History:  Diagnosis Date  . CAD (coronary artery disease)   . CHF (congestive heart failure) (HCC)   . PAD (peripheral artery disease) (HCC)   . Type 2 diabetes mellitus (HCC)    Surgical Hx:  Past Surgical History:  Procedure Laterality Date  . CORONARY STENT PLACEMENT    . FEMORAL ARTERY STENT     Family Hx:  History reviewed. No pertinent family history. Social Hx:   Social History   Tobacco Use  . Smoking status: Former Smoker    Types: Cigarettes    Quit date: 02/09/2017    Years since quitting: 3.0  . Smokeless tobacco: Never Used  Substance Use Topics  . Alcohol use: Not Currently  . Drug use: Not Currently   Medication:    Home Medication:    Current Medication: @CURMEDTAB @   Allergies:  Patient has no known allergies.  Review of Systems: Gen:  Denies  fever,  sweats, chills HEENT: Denies blurred vision, double vision, ear pain, eye pain, hearing loss, nose bleeds, sore throat Cvc:  No dizziness, chest pain or heaviness Resp:   BREATHING BETTER Gi: Denies swallowing difficulty, stomach pain, nausea or vomiting, diarrhea, constipation, bowel incontinence Gu:  Denies bladder incontinence, burning urine Ext:   No Joint pain, stiffness or swelling Skin: No skin rash, easy bruising or bleeding or hives Endoc:  No polyuria, polydipsia , polyphagia or weight change Psych: No depression, insomnia or hallucinations  Other:  All other systems negative  Physical Examination:   VS: BP (!) 140/77   Pulse 63   Temp 98.4 F (36.9 C) (Oral)   Resp 18   Ht 5\' 6"  (1.676 m)   Wt 64.7 kg   SpO2 97%   BMI 23.02 kg/m   General Appearance: No distress GOOD COLOR Neuro: without focal findings, mental status, speech normal, alert and oriented, cranial nerves 2-12 intact, reflexes normal and symmetric, sensation grossly normal  HEENT: PERRLA, EOM intact, no ptosis, no other lesions noticed, Mallampati: Pulmonary:.No wheezing, No  rales  :   Cardiovascular:  Normal S1,S2.  No m/r/g.  .    Abdomen:Benign, Soft, non-tender, No masses, hepatosplenomegaly, No lymphadenopathy Endoc: No evident thyromegaly, no signs of acromegaly or Cushing features Skin:   warm, no rashes, no ecchymosis  Extremities: normal, no cyanosis, clubbing, no edema, warm with normal capillary refill. Other findings:   Labs results:   Recent Labs    02/07/20 2044 02/08/20 0427 02/09/20 0454 02/10/20 0357  HGB  --  12.6 12.0 12.3  HCT  --  39.7 38.3 37.8  MCV  --  89.8 90.3 87.3  WBC  --  12.7* 9.5 9.7  BUN  --  23 18 14   CREATININE  --  1.37* 1.10* 1.13*  GLUCOSE  --  82 94 75  CALCIUM  --  8.3* 8.2* 8.6*  INR 1.3*  --   --   --   ,  IMPRESSION: 1. Positive for acute right greater than left bilateral pulmonary emboli including small saddle embolus in the distal right  pulmonary artery. Elevation of RV LV ratio at 1.0, suspect for component of right heart strain, however there are other features such as dilatation of the pulmonary trunk and cardiomegaly suggesting underlying chronic disease. 2. Subsegmental atelectasis at the lower lobes.  Mild emphysema  Aortic Atherosclerosis (ICD10-I70.0) and Emphysema (ICD10-J43.9).     Assessment and Plan: Presented with acute hypoxic respiratory failure secondary to bilateral pulmonary emboli  including small saddle embolus in the distal right pulmonary artery and atelectasis  Elevated troponin likely demand ischemia secondary to bilateral pulmonary emboli, s/p mechanical embolectomy. Tolerated procedure will.reathing easier. Renal parameters improving. Copd stabe Following vascular post op care Supplemental O2 for dyspnea and/or hypoxia  Monitor for s/sx of bleeding and transfuse for hgb <7 Pulmonary hygiene    I have personally obtained a history, examined the patient, evaluated laboratory and imaging results, formulated the assessment and plan and placed orders.  The Patient requires high complexity decision making for assessment and support, frequent evaluation and titration of therapies, application of advanced monitoring technologies and extensive interpretation of multiple databases.   Ainslee Sou,M.D. Pulmonary & Critical care Medicine [facility]

## 2020-02-10 NOTE — H&P (Signed)
[facility] History & Physical Update  The patient was interviewed and re-examined.  The patient's previous History and Physical has been reviewed and is unchanged.  There is no change in the plan of care. We plan to proceed with the scheduled procedure.  Festus Barren, MD  02/10/2020, 12:05 PM

## 2020-02-10 NOTE — Progress Notes (Signed)
Pt returned to room from special recovery/ R groin site WNL/ pt attempted to sit up/ r groin site started bleeding/ pressure held at site/ Special recovery nurse came to bedside/ PAD ( ) applied- instructed to keep in place until AM / bleeding stopped/ VSS/ Dr. Wyn Quaker aware/ order to restart heparin gtt at 18:30/ pt instructed to call staff if bleeding reoccurs/ will monitor close.

## 2020-02-10 NOTE — Progress Notes (Signed)
PHARMACY CONSULT NOTE - FOLLOW UP  Pharmacy Consult for Electrolyte Monitoring and Replacement   Recent Labs: Potassium (mmol/L)  Date Value  02/10/2020 4.2   Magnesium (mg/dL)  Date Value  18/40/3754 1.5 (L)   Calcium (mg/dL)  Date Value  36/12/7701 8.6 (L)   Albumin (g/dL)  Date Value  40/35/2481 3.4 (L)   Phosphorus (mg/dL)  Date Value  85/90/9311 2.5   Sodium (mmol/L)  Date Value  02/10/2020 138   Assessment: 64 yo female admitted with acute hypoxic respiratory failure secondary to bilateral pulmonary emboli including small saddle embolus in the distal right pulmonary artery.  Pharmacy asked to replace lytes per protocol.   Goal of Therapy:  Electrolytes WNL, K+ >/= 4  Plan:  --Magnesium 1.5, will order magnesium sulfate 2 g IV x 1.  --Will continue to monitor and adjust per protocol.  Tressie Ellis 02/10/2020 7:48 AM

## 2020-02-10 NOTE — Op Note (Signed)
[facility]  Percutaneous Study/Intervention Procedural Note   Date of Surgery: 02/10/2020,4:42 PM  Surgeon: Festus Barren  Pre-operative Diagnosis: Symptomatic bilateral pulmonary emboli  Post-operative diagnosis:  Same  Procedure(s) Performed:  1.  Contrast injection right heart  2.  Thrombolysis bilateral pulmonary arteries with 2 mg of TPA in the left lower lobe pulmonary artery and 6 mg of TPA administered in the right main pulmonary artery  3.  Mechanical thrombectomy right middle lobe and right lower lobe pulmonary arteries with the penumbra CAT 12 device, and mechanical thrombectomy of the left lower lobe pulmonary artery with the penumbra CAT 12 device  4.  Selective catheter placement right middle and lower lobe pulmonary arteries  5.  Selective catheter placement left lower lobe pulmonary artery    Anesthesia: Conscious sedation was administered under my direct supervision by the interventional radiology RN. IV Versed plus fentanyl were utilized. Continuous ECG, pulse oximetry and blood pressure was monitored throughout the entire procedure.  Versed and fentanyl were administered intravenously.  Conscious sedation was administered for a total of 30 minutes using 3 of Versed and 75 mcg of Fentanyl.  EBL: 200 cc  Sheath: 11 French right femoral vein  Contrast: 40 cc   Fluoroscopy Time: 8.2 minutes  Indications:  Patient presents with pulmonary emboli. The patient is symptomatic with hypoxemia and dyspnea on exertion.  There is evidence of right heart strain on the CT angiogram. The patient is otherwise a good candidate for intervention and even the long-term benefits pulmonary angiography with thrombolysis is offered. The risks and benefits are reviewed long-term benefits are discussed. All questions are answered patient agrees to proceed.  Procedure:  Megan James a 64 y.o. female who was identified and appropriate procedural time out was performed.  The  patient was then placed supine on the table and prepped and draped in the usual sterile fashion.  Ultrasound was used to evaluate the right common femoral vein.  It was patent, as it was echolucent and compressible.  A digital ultrasound image was acquired for the permanent record.  A Seldinger needle was used to access the right common femoral vein under direct ultrasound guidance.  A 0.035 J wire was advanced without resistance and a 5Fr sheath was placed and then upsized to an 11 [place] sheath.    The wire and pigtail catheter were then negotiated into the right atrium and bolus injection of contrast was utilized to demonstrate the right ventricle and the pulmonary artery outflow. The wire and catheter were then negotiated into the main pulmonary artery where hand injection of contrast was utilized to demonstrate the pulmonary arteries and confirm the locations of the pulmonary emboli.  Selective catheterization was done in the left lower lobe as well as the right middle lobe and right lower lobe pulmonary arteries with the JR4 catheter.  3000 Units of heparin was then given and allowed to circulate.  TPA was reconstituted and delivered onto the table. A total of 8 milligrams of TPA was utilized.  2 mg was administered on the left side and the left lower lobe and 6 mg was administered on the right side in the right main pulmonary artery. This was then allowed to dwell.  The Penumbra Cat 12 catheter was then advanced up into the pulmonary vasculature.  This was done over the select catheter for support.  The right lung was addressed first. Catheter was negotiated into the right lower lobe and mechanical thrombectomy was performed after selective imaging.  Follow-up imaging demonstrated a good result and therefore the catheter was renegotiated into the right middle lobe pulmonary artery and again mechanical thrombectomy was performed after selective imaging showed significant clot burden.  The penumbra CAT  12 catheter was again used.  Follow-up imaging was then performed with significant improvement in thrombus burden in the right middle and lower lobes.  The thrombus burden in the right upper lobe was not as extensive with the catheter in the right main pulmonary artery, so I elected not to go after this with thrombectomy.  The Penumbra Cat 12 catheter was then negotiated to the opposite side. The left lung was then addressed. Catheter was negotiated into the left lower lobe pulmonary artery and mechanical thrombectomy was performed for selective imaging showed extensive clot burden. Follow-up imaging demonstrated a good result with minimal residual thrombus in the left lower lobe.  It was known from the CT scan that the left upper lobe did not have significant clot burden, so no selective image performed in the left upper lobe.  After review these images wires were reintroduced and the catheter was removed. Then, the sheath is then pulled and pressures held.     Findings:   Right heart imaging:  Right atrium and right ventricle and the pulmonary outflow tract appears mildly dilated  Right lung: Fairly extensive thrombus seen in the right lower lobe and moderate to extensive thrombus in her right middle lobe.  The right upper lobe had thrombus although less extensive.  Left lung: Moderate to extensive thrombus in the left lower lobe.  Left upper lobe was not directly imaged as the CT scan showed no significant thrombus in the left upper lobe.    Disposition: Patient was taken to the recovery room in stable condition having tolerated the procedure well.  Festus Barren 02/10/2020,4:42 PM

## 2020-02-10 NOTE — Progress Notes (Signed)
Patient is a 64 yo female admitted with acute hypoxic respiratory failure secondary to bilateral pulmonary emboli including small saddle embolus in the distal right pulmonary artery.  She is status post mechanical thrombectomy of right middle lobe and right lower lobe pulmonary arteries as well as mechanical thrombectomy of the left lower lobe pulmonary artery. Patient was seen and examined at bedside postprocedure which she tolerated well.  She states that her breathing has improved We will start patient on a low-sodium diet and will discontinue dextrose containing fluids once blood sugars are stable. Continue pain control

## 2020-02-10 NOTE — Progress Notes (Signed)
ANTICOAGULATION CONSULT NOTE -  Pharmacy Consult for Heparin  Indication: pulmonary embolus  No Known Allergies  Patient Measurements: Height: 5\' 6"  (167.6 cm) Weight: 64.7 kg (142 lb 9.6 oz) IBW/kg (Calculated) : 59.3 Heparin Dosing Weight: 65.8 kg   Vital Signs: Temp: 98.7 F (37.1 C) (07/26 0417) Temp Source: Oral (07/26 0417) BP: 142/67 (07/26 0417) Pulse Rate: 55 (07/26 0417)  Labs: Recent Labs    02/07/20 1650 02/07/20 1723 02/07/20 1848 02/07/20 2044 02/08/20 0427 02/08/20 0427 02/08/20 1229 02/08/20 2056 02/09/20 0454 02/10/20 0357  HGB   < >  --   --   --  12.6   < >  --   --  12.0 12.3  HCT   < >  --   --   --  39.7  --   --   --  38.3 37.8  PLT   < >  --   --   --  185  --   --   --  186 218  APTT  --   --   --  30  --   --   --   --   --   --   LABPROT  --   --   --  15.7*  --   --   --   --   --   --   INR  --   --   --  1.3*  --   --   --   --   --   --   HEPARINUNFRC  --   --   --   --  0.79*  --    < > 0.65 0.45 0.41  CREATININE   < > 1.78*  --   --  1.37*  --   --   --  1.10* 1.13*  TROPONINIHS  --  127* 127*  --  50*  --   --   --   --   --    < > = values in this interval not displayed.    Estimated Creatinine Clearance: 48.3 mL/min (A) (by C-G formula based on SCr of 1.13 mg/dL (H)).   Medical History: Past Medical History:  Diagnosis Date  . CAD (coronary artery disease)   . CHF (congestive heart failure) (HCC)   . PAD (peripheral artery disease) (HCC)   . Type 2 diabetes mellitus (HCC)     Medications:  Medications Prior to Admission  Medication Sig Dispense Refill Last Dose  . ASPIRIN LOW DOSE 81 MG EC tablet Take 81 mg by mouth daily.   24-48 hours at Unknown  . atorvastatin (LIPITOR) 40 MG tablet Take 40 mg by mouth daily.   24-48 hours at Unknown  . enalapril (VASOTEC) 20 MG tablet Take 20 mg by mouth daily.   24-48 hours at Unknown  . GNP ACID REDUCER MAXIMUM ST 20 MG tablet Take 20 mg by mouth at bedtime.   24-48 hours at  Unknown  . isosorbide mononitrate (IMDUR) 60 MG 24 hr tablet Take 60 mg by mouth daily.   24-48 hours at Unknown  . methadone (DOLOPHINE) 10 MG/ML solution Take 150 mg by mouth daily.   24-48 hours at Unknown  . NIFEdipine (PROCARDIA XL/NIFEDICAL-XL) 90 MG 24 hr tablet Take 90 mg by mouth daily.   24-48 hours at Unknown  . PLAVIX 75 MG tablet Take 75 mg by mouth daily.   24-48 hours at Unknown  . Vitamin D, Ergocalciferol, (DRISDOL) 1.25 MG (  50000 UNIT) CAPS capsule Take 50,000 Units by mouth once a week.   Past Week at Unknown    Assessment: Pharmacy consulted to dose heparin in this 64 year old female with P.E.    CrCl = 30.7 ml/min ,  No prior anticoag noted  0724 0427 HL 0.79 SUPRAtherapeutic - rate decr to 950 units/hr 0724 @1229  HL 0.70 therapeutic (upper range) - rate decr to 900 units/hr 0724 @ 2056 HL 0.65 therapeutic 0725 0454 HL 0.45, therapeutic x 2, CBC stable 0726 0357 HL 0.41, therapeutic x 3, CBC stable  Goal of Therapy:  Heparin level 0.3-0.7 units/ml Monitor platelets by anticoagulation protocol: Yes   Plan:  Continue Heparin at current rate.  Recheck HL and CBC in am  Clinical Pharmacist 02/10/2020 5:07 AM

## 2020-02-11 ENCOUNTER — Encounter: Payer: Self-pay | Admitting: Vascular Surgery

## 2020-02-11 ENCOUNTER — Inpatient Hospital Stay: Payer: Medicaid - Out of State

## 2020-02-11 DIAGNOSIS — I2692 Saddle embolus of pulmonary artery without acute cor pulmonale: Secondary | ICD-10-CM

## 2020-02-11 DIAGNOSIS — J9601 Acute respiratory failure with hypoxia: Secondary | ICD-10-CM

## 2020-02-11 DIAGNOSIS — I2602 Saddle embolus of pulmonary artery with acute cor pulmonale: Secondary | ICD-10-CM | POA: Diagnosis not present

## 2020-02-11 LAB — BASIC METABOLIC PANEL
Anion gap: 9 (ref 5–15)
BUN: 13 mg/dL (ref 8–23)
CO2: 22 mmol/L (ref 22–32)
Calcium: 8.1 mg/dL — ABNORMAL LOW (ref 8.9–10.3)
Chloride: 105 mmol/L (ref 98–111)
Creatinine, Ser: 1.02 mg/dL — ABNORMAL HIGH (ref 0.44–1.00)
GFR calc Af Amer: >60 mL/min (ref >60)
GFR calc non Af Amer: 59 mL/min — ABNORMAL LOW (ref >60)
Glucose, Bld: 113 mg/dL — ABNORMAL HIGH (ref 70–99)
Potassium: 3.7 mmol/L (ref 3.5–5.1)
Sodium: 136 mmol/L (ref 135–145)

## 2020-02-11 LAB — GLUCOSE, CAPILLARY
Glucose-Capillary: 100 mg/dL — ABNORMAL HIGH (ref 70–99)
Glucose-Capillary: 120 mg/dL — ABNORMAL HIGH (ref 70–99)
Glucose-Capillary: 110 mg/dL — ABNORMAL HIGH (ref 70–99)
Glucose-Capillary: 129 mg/dL — ABNORMAL HIGH (ref 70–99)

## 2020-02-11 LAB — MAGNESIUM: Magnesium: 1.6 mg/dL — ABNORMAL LOW (ref 1.7–2.4)

## 2020-02-11 LAB — CBC WITH DIFFERENTIAL/PLATELET
Abs Immature Granulocytes: 0.05 10*3/uL (ref 0.00–0.07)
Basophils Absolute: 0 10*3/uL (ref 0.0–0.1)
Basophils Relative: 0 %
Eosinophils Absolute: 0.4 10*3/uL (ref 0.0–0.5)
Eosinophils Relative: 4 %
HCT: 32 % — ABNORMAL LOW (ref 36.0–46.0)
Hemoglobin: 10.4 g/dL — ABNORMAL LOW (ref 12.0–15.0)
Immature Granulocytes: 1 %
Lymphocytes Relative: 22 %
Lymphs Abs: 2 10*3/uL (ref 0.7–4.0)
MCH: 28.3 pg (ref 26.0–34.0)
MCHC: 32.5 g/dL (ref 30.0–36.0)
MCV: 87.2 fL (ref 80.0–100.0)
Monocytes Absolute: 0.7 10*3/uL (ref 0.1–1.0)
Monocytes Relative: 8 %
Neutro Abs: 6 10*3/uL (ref 1.7–7.7)
Neutrophils Relative %: 65 %
Platelets: 207 10*3/uL (ref 150–400)
RBC: 3.67 MIL/uL — ABNORMAL LOW (ref 3.87–5.11)
RDW: 14.8 % (ref 11.5–15.5)
WBC: 9.2 10*3/uL (ref 4.0–10.5)
nRBC: 0 % (ref 0.0–0.2)

## 2020-02-11 LAB — HEPARIN LEVEL (UNFRACTIONATED): Heparin Unfractionated: 0.3 IU/mL (ref 0.30–0.70)

## 2020-02-11 LAB — PHOSPHORUS: Phosphorus: 2.5 mg/dL (ref 2.5–4.6)

## 2020-02-11 MED ORDER — POTASSIUM CHLORIDE CRYS ER 20 MEQ PO TBCR
40.0000 meq | EXTENDED_RELEASE_TABLET | Freq: Once | ORAL | Status: AC
  Administered 2020-02-11: 40 meq via ORAL
  Filled 2020-02-11: qty 2

## 2020-02-11 MED ORDER — APIXABAN 5 MG PO TABS
10.0000 mg | ORAL_TABLET | Freq: Two times a day (BID) | ORAL | Status: DC
  Administered 2020-02-11 – 2020-02-13 (×5): 10 mg via ORAL
  Filled 2020-02-11 (×5): qty 2

## 2020-02-11 MED ORDER — MAGNESIUM SULFATE 2 GM/50ML IV SOLN
2.0000 g | Freq: Once | INTRAVENOUS | Status: AC
  Administered 2020-02-11: 2 g via INTRAVENOUS
  Filled 2020-02-11: qty 50

## 2020-02-11 MED ORDER — APIXABAN 5 MG PO TABS: 5.0000 mg | ORAL_TABLET | Freq: Two times a day (BID) | ORAL | Status: DC

## 2020-02-11 MED ORDER — HYDROCODONE-ACETAMINOPHEN 5-325 MG PO TABS: 1.0000 | ORAL_TABLET | Freq: Four times a day (QID) | ORAL | Status: DC | PRN

## 2020-02-11 NOTE — Progress Notes (Signed)
ANTICOAGULATION CONSULT NOTE  Pharmacy Consult for Apixaban Indication: pulmonary embolus  No Known Allergies  Patient Measurements: Height: 5\' 6"  (167.6 cm) Weight: 66.1 kg (145 lb 12.8 oz) IBW/kg (Calculated) : 59.3 Heparin Dosing Weight: 65.8 kg   Vital Signs: Temp: 98.7 F (37.1 C) (07/27 0807) Temp Source: Oral (07/27 0807) BP: 149/82 (07/27 0807) Pulse Rate: 56 (07/27 0807)  Labs: Recent Labs    02/09/20 0454 02/09/20 0454 02/10/20 0357 02/11/20 0542  HGB 12.0   < > 12.3 10.4*  HCT 38.3  --  37.8 32.0*  PLT 186  --  218 207  HEPARINUNFRC 0.45  --  0.41 0.30  CREATININE 1.10*  --  1.13* 1.02*   < > = values in this interval not displayed.    Estimated Creatinine Clearance: 53.5 mL/min (A) (by C-G formula based on SCr of 1.02 mg/dL (H)).   Medical History: Past Medical History:  Diagnosis Date   CAD (coronary artery disease)    CHF (congestive heart failure) (HCC)    PAD (peripheral artery disease) (HCC)    Type 2 diabetes mellitus (HCC)     Medications:  Medications Prior to Admission  Medication Sig Dispense Refill Last Dose   ASPIRIN LOW DOSE 81 MG EC tablet Take 81 mg by mouth daily.   24-48 hours at Unknown   atorvastatin (LIPITOR) 40 MG tablet Take 40 mg by mouth daily.   24-48 hours at Unknown   enalapril (VASOTEC) 20 MG tablet Take 20 mg by mouth daily.   24-48 hours at Unknown   GNP ACID REDUCER MAXIMUM ST 20 MG tablet Take 20 mg by mouth at bedtime.   24-48 hours at Unknown   isosorbide mononitrate (IMDUR) 60 MG 24 hr tablet Take 60 mg by mouth daily.   24-48 hours at Unknown   methadone (DOLOPHINE) 10 MG/ML solution Take 150 mg by mouth daily.   24-48 hours at Unknown   NIFEdipine (PROCARDIA XL/NIFEDICAL-XL) 90 MG 24 hr tablet Take 90 mg by mouth daily.   24-48 hours at Unknown   PLAVIX 75 MG tablet Take 75 mg by mouth daily.   24-48 hours at Unknown   Vitamin D, Ergocalciferol, (DRISDOL) 1.25 MG (50000 UNIT) CAPS capsule Take  50,000 Units by mouth once a week.   Past Week at Unknown    Assessment: Patient is a 64 y/o F who presented with PE and was initiated on heparin infusion. Patient is now s/p pulmonary thrombectomy / pulmonary lysis on 7/26. Pharmacy has been consulted to transition patient from heparin infusion to apixaban.    Plan:  --Discontinue heparin drip --After discontinuation of drip, start apixaban 10 mg BID x 7 days followed by apixaban 5 mg BID for duration of therapy --Plan discussed with RN --CBC per protocol  8/26 02/11/2020 10:17 AM

## 2020-02-11 NOTE — Progress Notes (Signed)
Date: 02/11/2020,   MRN# [id number] Megan James 09-Jun-1956 Code Status:     Code Status Orders  (From admission, onward)         Start     Ordered   02/08/20 0310  Full code  Continuous        02/08/20 0313        Code Status History    Date Active Date Inactive Code Status Order ID Comments User Context   02/07/2020 2040 02/08/2020 0313 Full Code [id number]  Andris Baumann, MD ED   Advance Care Planning Activity      ness Resp:    Gi: Denies swallowing difficulty, stomach pain, nausea or vomiting, diarrhea, constipation, bowel incontinence Gu:  Denies bladder incontinence, burning urine Ext:   No Joint pain, stiffness or swelling Skin: No skin rash, easy bruising or bleeding or hives Endoc:  No polyuria, polydipsia , polyphagia or weight change Psych: No depression, insomnia or hallucinations  Other:  All other systems negative  Physical Examination:   VS: BP (!) 140/73 (BP Location: Left Arm)   Pulse 55   Temp 98.5 F (36.9 C) (Oral)   Resp 18   Ht 5\' 6"  (1.676 m)   Wt 66.1 kg   SpO2 95%   BMI 23.53 kg/m   General Appearance: No distress  Neuro: without focal findings, mental status, speech normal, alert and oriented, cranial nerves 2-12 intact, reflexes normal and symmetric, sensation grossly normal  HEENT: PERRLA, EOM intact, no ptosis, no other lesions noticed, Mallampati: Pulmonary:.No wheezing, No rales  Sputum Production:   Cardiovascular:  Normal S1,S2.  No m/r/g.  Abdominal aorta pulsation normal.    Abdomen:Benign, Soft, non-tender, No masses, hepatosplenomegaly, No lymphadenopathy Endoc: No evident thyromegaly, no signs of acromegaly or Cushing features Skin:   warm, no rashes, no ecchymosis  Extremities: normal, no cyanosis, clubbing, no edema, warm with normal capillary refill. Other findings:   Labs results:   Recent Labs    02/09/20 0454 02/10/20 0357 02/11/20 0542  HGB 12.0 12.3 10.4*  HCT 38.3 37.8 32.0*  MCV 90.3 87.3 87.2  WBC 9.5 9.7  9.2  BUN 18 14 13   CREATININE 1.10* 1.13* 1.02*  GLUCOSE 94 75 113*  CALCIUM 8.2* 8.6* 8.1*  ,    No results for input(s): PH in the last 72 hours.  Invalid input(s): PCO2, PO2, BASEEXCESS, BASEDEFICITE, TFT  Culture results:     Rad results:   PERIPHERAL VASCULAR CATHETERIZATION  Result Date: 02/10/2020 See op note     EKG:     Date: 02/11/2020,   MRN# 02/12/2020 Megan James October 10, 1955 Code Status:     Code Status Orders  (From admission, onward)         Start     Ordered   02/08/20 0310  Full code  Continuous        02/08/20 0313        Code Status History    Date Active Date Inactive Code Status Order ID Comments User Context   02/07/2020 2040 02/08/2020 0313 Full Code 02/09/2020  02/10/2020, MD ED   Advance Care Planning Activity        HPI: no new complaints, breathing well. No bleeding. Answered all of her questions.   PMHX:   Past Medical History:  Diagnosis Date  . CAD (coronary artery disease)   . CHF (congestive heart failure) (HCC)   . PAD (peripheral artery disease) (HCC)   . Type 2 diabetes mellitus (HCC)  Surgical Hx:  Past Surgical History:  Procedure Laterality Date  . CORONARY STENT PLACEMENT    . FEMORAL ARTERY STENT    . PULMONARY THROMBECTOMY Bilateral 02/10/2020   Procedure: PULMONARY THROMBECTOMY;  Surgeon: Annice Needy, MD;  Location: [facility] INVASIVE CV LAB;  Service: Cardiovascular;  Laterality: Bilateral;   Family Hx:  History reviewed. No pertinent family history. Social Hx:   Social History   Tobacco Use  . Smoking status: Former Smoker    Types: Cigarettes    Quit date: 02/09/2017    Years since quitting: 3.0  . Smokeless tobacco: Never Used  Substance Use Topics  . Alcohol use: Not Currently  . Drug use: Not Currently   Medication:    Home Medication:    Current Medication: @CURMEDTAB @   Allergies:  Patient has no known allergies.  Review of Systems: Gen:  Denies  fever, sweats, chills, no  bleeding HEENT: Denies blurred vision, double vision, ear pain, eye pain, hearing loss, nose bleeds, sore throat Cvc:  No dizziness, chest pain or heaviness Resp:  No breathing difficlty  Gi: Denies swallowing difficulty, stomach pain, nausea or vomiting, diarrhea, constipation, bowel incontinence Gu:  Denies bladder incontinence, burning urine Ext:   No Joint pain, stiffness or swelling Skin: No skin rash, easy bruising or bleeding or hives Endoc:  No polyuria, polydipsia , polyphagia or weight change Psych: No depression, insomnia or hallucinations  Other:  All other systems negative  Physical Examination:   VS: BP (!) 140/73 (BP Location: Left Arm)   Pulse 55   Temp 98.5 F (36.9 C) (Oral)   Resp 18   Ht 5\' 6"  (1.676 m)   Wt 66.1 kg   SpO2 95%   BMI 23.53 kg/m   General Appearance: No distress  Neuro: without focal findings, mental status, speech normal, alert and oriented, cranial nerves 2-12 intact, reflexes normal and symmetric, sensation grossly normal  HEENT: PERRLA, EOM intact, no ptosis, no other lesions noticed, Mallampati: Pulmonary:.No wheezing, No rales     Cardiovascular:  Normal S1,S2.  No m/r/g.   Abdomen:Benign, Soft, non-tender, No masses, hepatosplenomegaly, No lymphadenopathy Endoc: No evident thyromegaly, no signs of acromegaly or Cushing features Skin:   warm, no rashes, no ecchymosis  Extremities: normal, no cyanosis, clubbing, no edema, warm with normal capillary refill. Other findings:   Labs results:   Recent Labs    02/09/20 0454 02/10/20 0357 02/11/20 0542  HGB 12.0 12.3 10.4*  HCT 38.3 37.8 32.0*  MCV 90.3 87.3 87.2  WBC 9.5 9.7 9.2  BUN 18 14 13   CREATININE 1.10* 1.13* 1.02*  GLUCOSE 94 75 113*  CALCIUM 8.2* 8.6* 8.1*  ,     Assessment and Plan: Presented with acute hypoxic respiratory failure secondary to bilateral pulmonary emboli including small saddle embolus in the distal right pulmonary arteryand atelectasis  Elevated  troponin likely demand ischemia secondary to bilateral pulmonary emboli, s/p mechanical embolectomy. Tolerated procedure will. breathing easier.  Copd stabe Following vascular post op care Agree to transition to eliquis Lower extremity venous doppler orders Home soon    I have personally obtained a history, examined the patient, evaluated laboratory and imaging results, formulated the assessment and plan and placed orders.  The Patient requires high complexity decision making for assessment and support, frequent evaluation and titration of therapies, application of advanced monitoring technologies and extensive interpretation of multiple databases.   Arish Redner,M.D. Pulmonary & Critical care Medicine [facility]

## 2020-02-11 NOTE — Progress Notes (Signed)
ANTICOAGULATION CONSULT NOTE -  Pharmacy Consult for Heparin  Indication: pulmonary embolus  No Known Allergies  Patient Measurements: Height: 5\' 6"  (167.6 cm) Weight: 66.1 kg (145 lb 12.8 oz) IBW/kg (Calculated) : 59.3 Heparin Dosing Weight: 65.8 kg   Vital Signs: Temp: 99.2 F (37.3 C) (07/27 0432) Temp Source: Oral (07/27 0432) BP: 139/84 (07/27 0432) Pulse Rate: 60 (07/27 0432)  Labs: Recent Labs    02/09/20 0454 02/09/20 0454 02/10/20 0357 02/11/20 0542  HGB 12.0   < > 12.3 10.4*  HCT 38.3  --  37.8 32.0*  PLT 186  --  218 207  HEPARINUNFRC 0.45  --  0.41 0.30  CREATININE 1.10*  --  1.13* 1.02*   < > = values in this interval not displayed.    Estimated Creatinine Clearance: 53.5 mL/min (A) (by C-G formula based on SCr of 1.02 mg/dL (H)).   Medical History: Past Medical History:  Diagnosis Date  . CAD (coronary artery disease)   . CHF (congestive heart failure) (HCC)   . PAD (peripheral artery disease) (HCC)   . Type 2 diabetes mellitus (HCC)     Medications:  Medications Prior to Admission  Medication Sig Dispense Refill Last Dose  . ASPIRIN LOW DOSE 81 MG EC tablet Take 81 mg by mouth daily.   24-48 hours at Unknown  . atorvastatin (LIPITOR) 40 MG tablet Take 40 mg by mouth daily.   24-48 hours at Unknown  . enalapril (VASOTEC) 20 MG tablet Take 20 mg by mouth daily.   24-48 hours at Unknown  . GNP ACID REDUCER MAXIMUM ST 20 MG tablet Take 20 mg by mouth at bedtime.   24-48 hours at Unknown  . isosorbide mononitrate (IMDUR) 60 MG 24 hr tablet Take 60 mg by mouth daily.   24-48 hours at Unknown  . methadone (DOLOPHINE) 10 MG/ML solution Take 150 mg by mouth daily.   24-48 hours at Unknown  . NIFEdipine (PROCARDIA XL/NIFEDICAL-XL) 90 MG 24 hr tablet Take 90 mg by mouth daily.   24-48 hours at Unknown  . PLAVIX 75 MG tablet Take 75 mg by mouth daily.   24-48 hours at Unknown  . Vitamin D, Ergocalciferol, (DRISDOL) 1.25 MG (50000 UNIT) CAPS capsule Take  50,000 Units by mouth once a week.   Past Week at Unknown    Assessment: Pharmacy consulted to dose heparin in this 64 year old female with P.E.    CrCl = 30.7 ml/min ,  No prior anticoag noted  0724 0427 HL 0.79 SUPRAtherapeutic - rate decr to 950 units/hr 0724 @1229  HL 0.70 therapeutic (upper range) - rate decr to 900 units/hr 0724 @ 2056 HL 0.65 therapeutic 0725 0454 HL 0.45, therapeutic x 2, CBC stable 0726 0357 HL 0.41, therapeutic x 3, CBC stable  Goal of Therapy:  Heparin level 0.3-0.7 units/ml Monitor platelets by anticoagulation protocol: Yes   Plan:  07/27 @ 0500 HL 0.30 therapeutic. Will continue current rate and will recheck HL w/ am labs, CBC trended s/t thrombectomy, will continue to monitor.  , PharmD, BCPS Clinical Pharmacist 02/11/2020 7:12 AM

## 2020-02-11 NOTE — Progress Notes (Signed)
PHARMACY CONSULT NOTE - FOLLOW UP  Pharmacy Consult for Electrolyte Monitoring and Replacement   Recent Labs: Potassium (mmol/L)  Date Value  02/11/2020 3.7   Magnesium (mg/dL)  Date Value  77/82/4235 1.6 (L)   Calcium (mg/dL)  Date Value  36/14/4315 8.1 (L)   Albumin (g/dL)  Date Value  40/02/6760 3.4 (L)   Phosphorus (mg/dL)  Date Value  95/03/3266 2.5   Sodium (mmol/L)  Date Value  02/11/2020 136   Assessment: 64 yo female admitted with acute hypoxic respiratory failure secondary to bilateral pulmonary emboli including small saddle embolus in the distal right pulmonary artery.  Pharmacy asked to replace lytes per protocol.   Goal of Therapy:  Electrolytes WNL, K+ >/= 4  Plan:  --Magnesium 1.6, will order magnesium sulfate 2 g IV x 1 --Potassium 3.7, will order oral KCl 40 mEq x 1 --Will continue to monitor and adjust per protocol.  Tressie Ellis 02/11/2020 7:31 AM

## 2020-02-11 NOTE — Progress Notes (Signed)
[facility] Daily Progress Note  Subjective: Patient with improved breathing this AM.  No issues overnight.  Objective: Vitals:   02/10/20 1752 02/10/20 2012 02/11/20 0432 02/11/20 0807  BP: (!) 140/77 (!) 142/67 (!) 139/84 (!) 149/82  Pulse: 63 72 60 56  Resp: 18 22 20 18   Temp:  99.5 F (37.5 C) 99.2 F (37.3 C) 98.7 F (37.1 C)  TempSrc:  Oral Oral Oral  SpO2: 97% 92% 92% 91%  Weight:   66.1 kg   Height:        Intake/Output Summary (Last 24 hours) at 02/11/2020 1002 Last data filed at 02/11/2020 0440 Gross per 24 hour  Intake 1609.98 ml  Output 400 ml  Net 1209.98 ml   Physical Exam: A&Ox3, NAD CV: RRR Pulmonary: CTA Bilaterally, nonlabored Abdomen: Soft, Nontender, Nondistended Right groin:  PAD in place.  No swelling or drainage. Vascular:  Bilateral legs: Thigh soft.  Calf soft extremities warm distally in toes.   Laboratory: CBC    Component Value Date/Time   WBC 9.2 02/11/2020 0542   HGB 10.4 (L) 02/11/2020 0542   HCT 32.0 (L) 02/11/2020 0542   PLT 207 02/11/2020 0542   BMET    Component Value Date/Time   NA 136 02/11/2020 0542   K 3.7 02/11/2020 0542   CL 105 02/11/2020 0542   CO2 22 02/11/2020 0542   GLUCOSE 113 (H) 02/11/2020 0542   BUN 13 02/11/2020 0542   CREATININE 1.02 (H) 02/11/2020 0542   CALCIUM 8.1 (L) 02/11/2020 0542   GFRNONAA 59 (L) 02/11/2020 0542   GFRAA >60 02/11/2020 0542   Assessment/Planning: The patient is a 64 year old female presented with PE status post pulmonary thrombectomy / pulmonary lysis - 02/10/20  1) Vitals stable this AM.  Patient states improvement in breathing. 2) 2g drop in hemoglobin.  AM CBC. 3) KVO 4) Transition from heparin to Eliquis with loading dose 5) Ambulation today 6) Decreased Vicodin dosage 7) Ordered bilateral venous duplex to rule out DVT 8) Remove PAD  Discussed with Dr. 02/12/20 Lorenza Winkleman PA-C 02/11/2020 10:02 AM

## 2020-02-11 NOTE — Progress Notes (Signed)
Progress Note    Megan James  WFU:[id number] DOB: [birth year]  DOA: 02/07/2020 PCP: Patient, No Pcp Per      Brief Narrative:    Medical records reviewed and are as summarized below:  Megan James is a 64 y.o. female with PMH of Type II Diabetes Mellitus, PAD s/p femoral stents, CAD s/p coronary stent, CHF.  She presented to [facility] ER on 07/23 via EMS with c/o worsening shortness of breath and right lower chest pain 3 days prior to admission. She also reports recent travel from [place] where she resides.   She was found to have acute bilateral pulmonary embolism with saddle embolus and right heart strain complicated by acute hypoxemic respiratory failure with oxygen saturation in the 70s.  She was treated with IV heparin infusion and supplemental oxygen.  She was seen by the vascular surgeon and she underwent thrombolysis of bilateral pulmonary arteries and mechanical thrombectomy of right middle lobe and right lower lobe pulmonary arteries.    Assessment/Plan:   Principal Problem:   Saddle embolus of pulmonary artery with acute cor pulmonale (HCC) Active Problems:   Acute respiratory failure with hypoxia (HCC)   CAD (coronary artery disease)   PAD (peripheral artery disease) (HCC)   Elevated troponin   CKD (chronic kidney disease) stage 3, GFR 30-59 ml/min   Acute saddle pulmonary embolism (HCC)   Type 2 diabetes mellitus with other specified complication (HCC)   Pulmonary embolism (HCC)    Acute bilateral pulmonary embolism: s/p pulmonary thrombolysis and mechanical thrombectomy.  Continue Eliquis.  No evidence of DVT on venous duplex of the lower extremities  Acute hypoxemic respiratory failure: Continue 4L/min oxygen and taper off as able  CAD s/p coronary stent and PVD s/p femoral stents: Patient said she had coronary and femoral stents "many years ago".  Continue aspirin.  Discontinue Plavix since she has been started on Eliquis.  This was discussed with Dr. Wyn Quaker,  vascular surgeon via secure chat.    Mild emphysema: No acute issues  Body mass index is 23.53 kg/m.  Diet Order            Diet 2 gram sodium Room service appropriate? Yes; Fluid consistency: Thin  Diet effective now                       Medications:   . apixaban  10 mg Oral BID   Followed by  . [START ON 02/18/2020] apixaban  5 mg Oral BID  . atorvastatin  40 mg Oral Daily  . carbamide peroxide  10 drop Right EAR BID  . famotidine  20 mg Oral Daily  . insulin aspart  0-5 Units Subcutaneous QHS  . insulin aspart  0-9 Units Subcutaneous TID WC  . methadone  100 mg Oral Daily   Continuous Infusions:   Anti-infectives (From admission, onward)   Start     Dose/Rate Route Frequency Ordered Stop   02/11/20 0000  ceFAZolin (ANCEF) IVPB 2g/100 mL premix       Note to Pharmacy: To be given in specials   2 g 200 mL/hr over 30 Minutes Intravenous  Once 02/10/20 1151 02/10/20 1619             Family Communication/Anticipated D/C date and plan/Code Status   DVT prophylaxis: SCDs Start: 02/08/20 0310 apixaban (ELIQUIS) tablet 10 mg  apixaban (ELIQUIS) tablet 5 mg     Code Status: Full Code  Family Communication: Plan discussed with patient  Disposition Plan:    Status is: Inpatient  Remains inpatient appropriate because:Inpatient level of care appropriate due to severity of illness   Dispo: The patient is from: Home              Anticipated d/c is to: Home              Anticipated d/c date is: 1 day              Patient currently is not medically stable to d/c.           Subjective:   No chest pain or shortness of breath.  She feels better today.  Objective:    Vitals:   02/10/20 2012 02/11/20 0432 02/11/20 0807 02/11/20 1132  BP: (!) 142/67 (!) 139/84 (!) 149/82 (!) 140/73  Pulse: 72 60 56 55  Resp: 22 20 18 18   Temp: 99.5 F (37.5 C) 99.2 F (37.3 C) 98.7 F (37.1 C) 98.5 F (36.9 C)  TempSrc: Oral Oral Oral Oral  SpO2: 92% 92%  91% 95%  Weight:  66.1 kg    Height:       No data found.   Intake/Output Summary (Last 24 hours) at 02/11/2020 1628 Last data filed at 02/11/2020 0930 Gross per 24 hour  Intake 2089.98 ml  Output 400 ml  Net 1689.98 ml   Filed Weights   02/09/20 1239 02/10/20 0116 02/11/20 0432  Weight: 66 kg 64.7 kg 66.1 kg    Exam:  GEN: NAD SKIN: No rash EYES: EOMI ENT: MMM CV: RRR PULM: CTA B ABD: soft, ND, NT, +BS CNS: AAO x 3, non focal EXT: No edema or tenderness   Data Reviewed:   I have personally reviewed following labs and imaging studies:  Labs: Labs show the following:   Basic Metabolic Panel: Recent Labs  Lab 02/07/20 1723 02/07/20 1723 02/08/20 0427 02/08/20 0427 02/09/20 0454 02/09/20 0454 02/10/20 0357 02/11/20 0542  NA 136  --  138  --  136  --  138 136  K 3.9   < > 4.2   < > 4.1   < > 4.2 3.7  CL 101  --  105  --  105  --  106 105  CO2 24  --  24  --  24  --  27 22  GLUCOSE 123*  --  82  --  94  --  75 113*  BUN 26*  --  23  --  18  --  14 13  CREATININE 1.78*  --  1.37*  --  1.10*  --  1.13* 1.02*  CALCIUM 8.6*  --  8.3*  --  8.2*  --  8.6* 8.1*  MG  --   --  1.6*  --  2.0  --  1.5* 1.6*  PHOS  --   --  2.5  --  2.6  --  2.5 2.5   < > = values in this interval not displayed.   GFR Estimated Creatinine Clearance: 53.5 mL/min (A) (by C-G formula based on SCr of 1.02 mg/dL (H)). Liver Function Tests: Recent Labs  Lab 02/07/20 1723  AST 39  ALT 28  ALKPHOS 68  BILITOT 0.8  PROT 8.9*  ALBUMIN 3.4*   Recent Labs  Lab 02/07/20 1723  LIPASE 27   No results for input(s): AMMONIA in the last 168 hours. Coagulation profile Recent Labs  Lab 02/07/20 2044  INR 1.3*    CBC: Recent Labs  Lab 02/07/20  1650 02/08/20 0427 02/09/20 0454 02/10/20 0357 02/11/20 0542  WBC 13.4* 12.7* 9.5 9.7 9.2  NEUTROABS 11.0*  --  7.7 6.7 6.0  HGB 12.6 12.6 12.0 12.3 10.4*  HCT 40.0 39.7 38.3 37.8 32.0*  MCV 89.1 89.8 90.3 87.3 87.2  PLT 200 185 186  218 207   Cardiac Enzymes: No results for input(s): CKTOTAL, CKMB, CKMBINDEX, TROPONINI in the last 168 hours. BNP (last 3 results) No results for input(s): PROBNP in the last 8760 hours. CBG: Recent Labs  Lab 02/10/20 1646 02/10/20 1750 02/10/20 2103 02/11/20 0809 02/11/20 1133  GLUCAP 77 86 116* 100* 120*   D-Dimer: No results for input(s): DDIMER in the last 72 hours. Hgb A1c: No results for input(s): HGBA1C in the last 72 hours. Lipid Profile: No results for input(s): CHOL, HDL, LDLCALC, TRIG, CHOLHDL, LDLDIRECT in the last 72 hours. Thyroid function studies: No results for input(s): TSH, T4TOTAL, T3FREE, THYROIDAB in the last 72 hours.  Invalid input(s): FREET3 Anemia work up: No results for input(s): VITAMINB12, FOLATE, FERRITIN, TIBC, IRON, RETICCTPCT in the last 72 hours. Sepsis Labs: Recent Labs  Lab 02/08/20 0427 02/09/20 0454 02/10/20 0357 02/11/20 0542  WBC 12.7* 9.5 9.7 9.2    Microbiology Recent Results (from the past 240 hour(s))  SARS Coronavirus 2 by RT PCR (hospital order, performed in [facility] hospital lab) Nasopharyngeal Nasopharyngeal Swab     Status: None   Collection Time: 02/07/20  6:35 PM   Specimen: Nasopharyngeal Swab  Result Value Ref Range Status   SARS Coronavirus 2 NEGATIVE NEGATIVE Final    Comment: (NOTE) SARS-CoV-2 target nucleic acids are NOT DETECTED.  The SARS-CoV-2 RNA is generally detectable in upper and lower respiratory specimens during the acute phase of infection. The lowest concentration of SARS-CoV-2 viral copies this assay can detect is 250 copies / mL. A negative result does not preclude SARS-CoV-2 infection and should not be used as the sole basis for treatment or other patient management decisions.  A negative result may occur with improper specimen collection / handling, submission of specimen other than nasopharyngeal swab, presence of viral mutation(s) within the areas targeted by this assay, and inadequate  number of viral copies (<250 copies / mL). A negative result must be combined with clinical observations, patient history, and epidemiological information.  Fact Sheet for Patients:   BoilerBrush.com.cy  Fact Sheet for Healthcare Providers: https://pope.com/  This test is not yet approved or  cleared by the [place] FDA and has been authorized for detection and/or diagnosis of SARS-CoV-2 by FDA under an Emergency Use Authorization (EUA).  This EUA will remain in effect (meaning this test can be used) for the duration of the COVID-19 declaration under Section 564(b)(1) of the Act, 21 U.S.C. section 360bbb-3(b)(1), unless the authorization is terminated or revoked sooner.  Performed at [facility], [address] [place], [place] [address]   MRSA PCR Screening     Status: None   Collection Time: 02/08/20  4:55 AM   Specimen: Nasopharyngeal  Result Value Ref Range Status   MRSA by PCR NEGATIVE NEGATIVE Final    Comment:        The GeneXpert MRSA Assay (FDA approved for NASAL specimens only), is one component of a comprehensive MRSA colonization surveillance program. It is not intended to diagnose MRSA infection nor to guide or monitor treatment for MRSA infections. Performed at [facility], [address], [place], [place] [address]     Procedures and diagnostic studies:  PERIPHERAL VASCULAR CATHETERIZATION  Result Date: 02/10/2020 See op note  US Venous Img Lower Bilateral (DVT)  Result Date: 02/11/2020 CLINICAL DATA:  Bilateral lower extremity pain and swelling. History of pulmonary embolism post recent pulmonary thrombectomy (02/10/2020). Evaluate for DVT. EXAM: BILATERAL LOWER EXTREMITY VENOUS DOPPLER ULTRASOUND TECHNIQUE: Gray-scale sonography with graded compression, as well as color Doppler and duplex ultrasound were performed to evaluate the lower extremity deep venous systems from the  level of the common femoral vein and including the common femoral, femoral, profunda femoral, popliteal and calf veins including the posterior tibial, peroneal and gastrocnemius veins when visible. The superficial great saphenous vein was also interrogated. Spectral Doppler was utilized to evaluate flow at rest and with distal augmentation maneuvers in the common femoral, femoral and popliteal veins. COMPARISON:  None. FINDINGS: RIGHT LOWER EXTREMITY Common Femoral Vein: No evidence of thrombus. Normal compressibility, respiratory phasicity and response to augmentation. Saphenofemoral Junction: No evidence of thrombus. Normal compressibility and flow on color Doppler imaging. Profunda Femoral Vein: No evidence of thrombus. Normal compressibility and flow on color Doppler imaging. Femoral Vein: No evidence of thrombus. Normal compressibility, respiratory phasicity and response to augmentation. Popliteal Vein: No evidence of thrombus. Normal compressibility, respiratory phasicity and response to augmentation. Calf Veins: No evidence of thrombus. Normal compressibility and flow on color Doppler imaging. Superficial Great Saphenous Vein: No evidence of thrombus. Normal compressibility. Venous Reflux:  None. Other Findings: Note is made of a vascular stent involving the proximal (image 10) mid (image 13 and distal (image 16) aspects of the right femoral vein, the patency of which is not evaluated on this examination. LEFT LOWER EXTREMITY Common Femoral Vein: No evidence of thrombus. Normal compressibility, respiratory phasicity and response to augmentation. Saphenofemoral Junction: No evidence of thrombus. Normal compressibility and flow on color Doppler imaging. Profunda Femoral Vein: No evidence of thrombus. Normal compressibility and flow on color Doppler imaging. Femoral Vein: No evidence of thrombus. Normal compressibility, respiratory phasicity and response to augmentation. Popliteal Vein: No evidence of thrombus.  Normal compressibility, respiratory phasicity and response to augmentation. Calf Veins: No evidence of thrombus. Normal compressibility and flow on color Doppler imaging. Superficial Great Saphenous Vein: No evidence of thrombus. Normal compressibility. Venous Reflux:  None. Other Findings:  None IMPRESSION: No evidence of DVT within either lower extremity. Electronically Signed   By: Simonne Come M.D.   On: 02/11/2020 15:35               LOS: 4 days   Megan James  [facility]     02/11/2020, 4:28 PM

## 2020-02-12 DIAGNOSIS — I2602 Saddle embolus of pulmonary artery with acute cor pulmonale: Secondary | ICD-10-CM | POA: Diagnosis not present

## 2020-02-12 DIAGNOSIS — J9601 Acute respiratory failure with hypoxia: Secondary | ICD-10-CM | POA: Diagnosis not present

## 2020-02-12 LAB — BASIC METABOLIC PANEL
Anion gap: 6 (ref 5–15)
BUN: 11 mg/dL (ref 8–23)
CO2: 26 mmol/L (ref 22–32)
Calcium: 8.4 mg/dL — ABNORMAL LOW (ref 8.9–10.3)
Chloride: 107 mmol/L (ref 98–111)
Creatinine, Ser: 1.02 mg/dL — ABNORMAL HIGH (ref 0.44–1.00)
GFR calc Af Amer: >60 mL/min (ref >60)
GFR calc non Af Amer: 59 mL/min — ABNORMAL LOW (ref >60)
Glucose, Bld: 122 mg/dL — ABNORMAL HIGH (ref 70–99)
Potassium: 4.3 mmol/L (ref 3.5–5.1)
Sodium: 139 mmol/L (ref 135–145)

## 2020-02-12 LAB — CBC
HCT: 32.9 % — ABNORMAL LOW (ref 36.0–46.0)
Hemoglobin: 10.1 g/dL — ABNORMAL LOW (ref 12.0–15.0)
MCH: 27.8 pg (ref 26.0–34.0)
MCHC: 30.7 g/dL (ref 30.0–36.0)
MCV: 90.6 fL (ref 80.0–100.0)
Platelets: 230 10*3/uL (ref 150–400)
RBC: 3.63 MIL/uL — ABNORMAL LOW (ref 3.87–5.11)
RDW: 15 % (ref 11.5–15.5)
WBC: 9 10*3/uL (ref 4.0–10.5)
nRBC: 0 % (ref 0.0–0.2)

## 2020-02-12 LAB — GLUCOSE, CAPILLARY
Glucose-Capillary: 101 mg/dL — ABNORMAL HIGH (ref 70–99)
Glucose-Capillary: 109 mg/dL — ABNORMAL HIGH (ref 70–99)
Glucose-Capillary: 102 mg/dL — ABNORMAL HIGH (ref 70–99)
Glucose-Capillary: 103 mg/dL — ABNORMAL HIGH (ref 70–99)

## 2020-02-12 LAB — PHOSPHORUS: Phosphorus: 3 mg/dL (ref 2.5–4.6)

## 2020-02-12 LAB — MAGNESIUM: Magnesium: 1.7 mg/dL (ref 1.7–2.4)

## 2020-02-12 MED ORDER — SODIUM CHLORIDE 0.9% FLUSH
10.0000 mL | Freq: Two times a day (BID) | INTRAVENOUS | Status: DC
  Administered 2020-02-12 – 2020-02-13 (×2): 10 mL via INTRAVENOUS

## 2020-02-12 MED ORDER — MAGNESIUM SULFATE 2 GM/50ML IV SOLN
2.0000 g | Freq: Once | INTRAVENOUS | Status: AC
  Administered 2020-02-12: 2 g via INTRAVENOUS
  Filled 2020-02-12: qty 50

## 2020-02-12 NOTE — Progress Notes (Signed)
Date: 02/12/2020,   MRN# [id number] Megan James 19-Apr-1956 Code Status:     Code Status Orders  (From admission, onward)         Start     Ordered   02/08/20 0310  Full code  Continuous        02/08/20 0313        Code Status History    Date Active Date Inactive Code Status Order ID Comments User Context   02/07/2020 2040 02/08/2020 0313 Full Code [id number]  Andris Baumann, MD ED   Advance Care Planning Activity        Date: 02/12/2020,   MRN# [id number] Megan James May 19, 1956 Code Status:     Code Status Orders  (From admission, onward)         Start     Ordered   02/08/20 0310  Full code  Continuous        02/08/20 0313        Code Status History    Date Active Date Inactive Code Status Order ID Comments User Context   02/07/2020 2040 02/08/2020 0313 Full Code [id number]  Andris Baumann, MD ED   Advance Care Planning Activity      HPI: No new problems, pulm wise stable to go home, no ride today. No bleeding, no black stools. No pleurisy   PMHX:   Past Medical History:  Diagnosis Date  . CAD (coronary artery disease)   . CHF (congestive heart failure) (HCC)   . PAD (peripheral artery disease) (HCC)   . Type 2 diabetes mellitus (HCC)    Surgical Hx:  Past Surgical History:  Procedure Laterality Date  . CORONARY STENT PLACEMENT    . FEMORAL ARTERY STENT    . PULMONARY THROMBECTOMY Bilateral 02/10/2020   Procedure: PULMONARY THROMBECTOMY;  Surgeon: Annice Needy, MD;  Location: [facility] INVASIVE CV LAB;  Service: Cardiovascular;  Laterality: Bilateral;   Family Hx:  History reviewed. No pertinent family history. Social Hx:   Social History   Tobacco Use  . Smoking status: Former Smoker    Types: Cigarettes    Quit date: 02/09/2017    Years since quitting: 3.0  . Smokeless tobacco: Never Used  Substance Use Topics  . Alcohol use: Not Currently  . Drug use: Not Currently   Medication:    Home Medication:    Current Medication: @CURMEDTAB @    Allergies:  Patient has no known allergies.  Review of Systems: Gen:  Denies  fever, sweats, chills HEENT: Denies blurred vision, double vision, ear pain, eye pain, hearing loss, nose bleeds, sore throat Cvc:  No dizziness, chest pain or heaviness Resp:  No resp distress at rest  Gi: Denies swallowing difficulty, stomach pain, nausea or vomiting, diarrhea, constipation, bowel incontinence Gu:  Denies bladder incontinence, burning urine Ext:   No Joint pain, stiffness or swelling Skin: No skin rash, easy bruising or bleeding or hives Endoc:  No polyuria, polydipsia , polyphagia or weight change Psych: No depression, insomnia or hallucinations  Other:  All other systems negative  Physical Examination:   VS: BP (!) 149/95 (BP Location: Left Arm)   Pulse 61   Temp 98.2 F (36.8 C) (Oral)   Resp 18   Ht 5\' 6"  (1.676 m)   Wt 64.4 kg   SpO2 94%   BMI 22.92 kg/m   General Appearance: No distress  Neuro: without focal findings, mental status, speech normal, alert and oriented, cranial nerves 2-12 intact, reflexes normal and symmetric, sensation  grossly normal  HEENT: PERRLA, EOM intact, no ptosis, no other lesions noticed, Mallampati: Pulmonary:.No wheezing, No rales  Sputum Production:   Cardiovascular:  Normal S1,S2.  No m/r/g.  Abdominal aorta pulsation normal.    Abdomen:Benign, Soft, non-tender, No masses, hepatosplenomegaly, No lymphadenopathy Endoc: No evident thyromegaly, no signs of acromegaly or Cushing features Skin:   warm, no rashes, no ecchymosis  Extremities: normal, no cyanosis, clubbing, no edema, warm with normal capillary refill. Other findings:   Labs results:   Recent Labs    02/10/20 0357 02/11/20 0542 02/12/20 0316  HGB 12.3 10.4* 10.1*  HCT 37.8 32.0* 32.9*  MCV 87.3 87.2 90.6  WBC 9.7 9.2 9.0  BUN 14 13 11   CREATININE 1.13* 1.02* 1.02*  GLUCOSE 75 113* 122*  CALCIUM 8.6* 8.1* 8.4*   Patient Saturations on Room Air at Rest =90%  Patient  Saturations on Room Air while Ambulating = 81%  Patient Saturations on 3 Liters of oxygen while Ambulating = 90%  Please briefly explain why patient needs home oxygen:         ,     Assessment and Plan: Presented with acute hypoxic respiratory failure secondary to bilateral pulmonary emboli including small saddle embolus in the distal right pulmonary arteryand atelectasis   s/p mechanical Thrombolectomy. Tolerated procedure will. breathing easier.  Copd stable. Stable to go home.  Following vascular post op care Eliquis as ordered Qualified for portable 02 3 liters with activity F/u with vascular Expected continued care with [place] docs [place]  I have personally obtained a history, examined the patient, evaluated laboratory and imaging results, formulated the assessment and plan and placed orders.  The Patient requires high complexity decision making for assessment and support, frequent evaluation and titration of therapies, application of advanced monitoring technologies and extensive interpretation of multiple databases.   [name],M.D. Pulmonary & Critical care Medicine [facility]

## 2020-02-12 NOTE — Progress Notes (Signed)
PROGRESS NOTE    Megan James   IZT:[id number]  DOB: May 30, 1956  PCP: Patient, No Pcp Per    DOA: 02/07/2020 LOS: 5   Brief Narrative   Megan James is a 64 y.o. female with PMH of Type II Diabetes Mellitus, PAD s/p femoral stents, CAD s/p coronary stent, CHF.  She presented to [facility] ER on 07/23 via EMS with c/o worsening shortness of breath and right lower chest pain 3 days prior to admission. She also reports recent travel from [place] where she resides.    She was found to have acute bilateral pulmonary embolism with saddle embolus and right heart strain complicated by acute hypoxemic respiratory failure with oxygen saturation in the 70s.  She was treated with IV heparin infusion and supplemental oxygen.  She was seen by the vascular surgeon and she underwent thrombolysis of bilateral pulmonary arteries and mechanical thrombectomy of right middle lobe and right lower lobe pulmonary arteries.     Assessment & Plan   Principal Problem:   Acute respiratory failure with hypoxia (HCC) Active Problems:   Elevated troponin   Acute saddle pulmonary embolism (HCC)   CKD (chronic kidney disease) stage 3, GFR 30-59 ml/min   Type 2 diabetes mellitus with other specified complication (HCC)   CAD (coronary artery disease)   PAD (peripheral artery disease) (HCC)   Acute respiratory failure with hypoxia secondary to  Acute bilateral pulmonary embolisms -present on admission, status post pulmonary thrombolysis and mechanical thrombectomy on 7/26.  Vascular surgery consulted.  Lower extremity venous Dopplers were negative for DVTs.  Continue aspirin.  Plavix has been discontinued now the patient is on Eliquis.  Continue Eliquis 10 mg twice daily x7 days then 5 mg twice daily thereafter.  Continue supplemental oxygen to maintain O2 sat above 90%.  Home oxygen has been ordered (qualified for 3 L/min).  Hypomagnesemia -replacing by IV.  Repeat Mg level with a.m. labs.  Elevated troponin - POA,  likely demand ischemia in the setting of acute PE and hypoxia.  No chest pain or acute ischemic changes on EKG.  CKD stage IIIa -patient resides in [place] so no baseline renal function is available.  Monitor BMP.  Avoid nephrotoxins and hypotension.  Renally dose meds as indicated.  Type 2 diabetes -on sliding scale NovoLog  Coronary artery disease /peripheral vascular disease status post femoral stents -chronic, stable.  Continue aspirin.  Plavix was discontinued after discussion with vascular surgeon as patient was started on Eliquis for acute PE.  COPD -not acutely exacerbated  Chronic pain -continue methadone   Patient BMI: Body mass index is 22.92 kg/m.   DVT prophylaxis: SCDs Start: 02/08/20 0310 apixaban (ELIQUIS) tablet 10 mg  apixaban (ELIQUIS) tablet 5 mg   Diet:  Diet Orders (From admission, onward)    Start     Ordered   02/10/20 1722  Diet 2 gram sodium Room service appropriate? Yes; Fluid consistency: Thin  Diet effective now       Question Answer Comment  Room service appropriate? Yes   Fluid consistency: Thin      02/10/20 1721            Code Status: Full Code    Subjective 02/12/20    Patient seen at bedside this morning.  No acute events reported overnight.  She denies any acute complaints today aside from still being short of breath with exertion.  She does state that she walks very little and usually does get short of breath at  baseline.  Has not had home oxygen prior to this admission.  She denies fevers or chills, chest pain or shortness of breath at rest, nausea vomiting diarrhea or other complaints.   Disposition Plan & Communication   Status is: Inpatient  Remains inpatient appropriate because:Unsafe d/c plan.  Patient does not have a ride home from hospital this afternoon, plan for discharge in the morning.   Dispo: The patient is from: Home              Anticipated d/c is to: Home              Anticipated d/c date is: 1 day               Patient currently is medically stable to d/c.   Family Communication: None at bedside, will attempt to call   Consults, Procedures, Significant Events   Consultants:   Vascular surgery  Procedures:   Thrombolysis and mechanical thrombectomy, 7/26  Antimicrobials:   None    Objective   Vitals:   02/11/20 2005 02/12/20 0537 02/12/20 0747 02/12/20 1118  BP: (!) 135/56 (!) 161/81 (!) 142/87 (!) 154/85  Pulse: 63 54 54 50  Resp:   17 18  Temp: 98.6 F (37 C) 98.4 F (36.9 C) 98.2 F (36.8 C) 98.1 F (36.7 C)  TempSrc: Oral Oral Oral Oral  SpO2: 94% 97% 97% 97%  Weight:  64.4 kg    Height:        Intake/Output Summary (Last 24 hours) at 02/12/2020 1634 Last data filed at 02/12/2020 1325 Gross per 24 hour  Intake 960 ml  Output 0 ml  Net 960 ml   Filed Weights   02/10/20 0116 02/11/20 0432 02/12/20 0537  Weight: 64.7 kg 66.1 kg 64.4 kg    Physical Exam:  General exam: awake, alert, no acute distress HEENT: moist mucus membranes, hearing grossly normal  Respiratory system: CTAB but diminished, no wheezes, rales or rhonchi, normal respiratory effort. Cardiovascular system: normal S1/S2, RRR, no pedal edema.   Gastrointestinal system: soft, NT, ND, no HSM felt, +bowel sounds. Central nervous system: A&O x3. no gross focal neurologic deficits, normal speech Extremities: moves all, no edema, normal tone Skin: dry, intact, normal temperature, normal color Psychiatry: normal mood, congruent affect, judgement and insight appear normal  Labs   Data Reviewed: I have personally reviewed following labs and imaging studies  CBC: Recent Labs  Lab 02/07/20 1650 02/07/20 1650 02/08/20 0427 02/09/20 0454 02/10/20 0357 02/11/20 0542 02/12/20 0316  WBC 13.4*   < > 12.7* 9.5 9.7 9.2 9.0  NEUTROABS 11.0*  --   --  7.7 6.7 6.0  --   HGB 12.6   < > 12.6 12.0 12.3 10.4* 10.1*  HCT 40.0   < > 39.7 38.3 37.8 32.0* 32.9*  MCV 89.1   < > 89.8 90.3 87.3 87.2 90.6  PLT  200   < > 185 186 218 207 230   < > = values in this interval not displayed.   Basic Metabolic Panel: Recent Labs  Lab 02/08/20 0427 02/09/20 0454 02/10/20 0357 02/11/20 0542 02/12/20 0316  NA 138 136 138 136 139  K 4.2 4.1 4.2 3.7 4.3  CL 105 105 106 105 107  CO2 24 24 27 22 26   GLUCOSE 82 94 75 113* 122*  BUN 23 18 14 13 11   CREATININE 1.37* 1.10* 1.13* 1.02* 1.02*  CALCIUM 8.3* 8.2* 8.6* 8.1* 8.4*  MG 1.6* 2.0 1.5* 1.6* 1.7  PHOS 2.5 2.6 2.5 2.5 3.0   GFR: Estimated Creatinine Clearance: 53.5 mL/min (A) (by C-G formula based on SCr of 1.02 mg/dL (H)). Liver Function Tests: Recent Labs  Lab 02/07/20 1723  AST 39  ALT 28  ALKPHOS 68  BILITOT 0.8  PROT 8.9*  ALBUMIN 3.4*   Recent Labs  Lab 02/07/20 1723  LIPASE 27   No results for input(s): AMMONIA in the last 168 hours. Coagulation Profile: Recent Labs  Lab 02/07/20 2044  INR 1.3*   Cardiac Enzymes: No results for input(s): CKTOTAL, CKMB, CKMBINDEX, TROPONINI in the last 168 hours. BNP (last 3 results) No results for input(s): PROBNP in the last 8760 hours. HbA1C: No results for input(s): HGBA1C in the last 72 hours. CBG: Recent Labs  Lab 02/11/20 1133 02/11/20 1633 02/11/20 2057 02/12/20 0748 02/12/20 1117  GLUCAP 120* 110* 129* 101* 109*   Lipid Profile: No results for input(s): CHOL, HDL, LDLCALC, TRIG, CHOLHDL, LDLDIRECT in the last 72 hours. Thyroid Function Tests: No results for input(s): TSH, T4TOTAL, FREET4, T3FREE, THYROIDAB in the last 72 hours. Anemia Panel: No results for input(s): VITAMINB12, FOLATE, FERRITIN, TIBC, IRON, RETICCTPCT in the last 72 hours. Sepsis Labs: No results for input(s): PROCALCITON, LATICACIDVEN in the last 168 hours.  Recent Results (from the past 240 hour(s))  SARS Coronavirus 2 by RT PCR (hospital order, performed in [facility] hospital lab) Nasopharyngeal Nasopharyngeal Swab     Status: None   Collection Time: 02/07/20  6:35 PM   Specimen:  Nasopharyngeal Swab  Result Value Ref Range Status   SARS Coronavirus 2 NEGATIVE NEGATIVE Final    Comment: (NOTE) SARS-CoV-2 target nucleic acids are NOT DETECTED.  The SARS-CoV-2 RNA is generally detectable in upper and lower respiratory specimens during the acute phase of infection. The lowest concentration of SARS-CoV-2 viral copies this assay can detect is 250 copies / mL. A negative result does not preclude SARS-CoV-2 infection and should not be used as the sole basis for treatment or other patient management decisions.  A negative result may occur with improper specimen collection / handling, submission of specimen other than nasopharyngeal swab, presence of viral mutation(s) within the areas targeted by this assay, and inadequate number of viral copies (<250 copies / mL). A negative result must be combined with clinical observations, patient history, and epidemiological information.  Fact Sheet for Patients:   BoilerBrush.com.cy  Fact Sheet for Healthcare Providers: https://pope.com/  This test is not yet approved or  cleared by the [place] FDA and has been authorized for detection and/or diagnosis of SARS-CoV-2 by FDA under an Emergency Use Authorization (EUA).  This EUA will remain in effect (meaning this test can be used) for the duration of the COVID-19 declaration under Section 564(b)(1) of the Act, 21 U.S.C. section 360bbb-3(b)(1), unless the authorization is terminated or revoked sooner.  Performed at [facility], [address] [place], [place] [address]   MRSA PCR Screening     Status: None   Collection Time: 02/08/20  4:55 AM   Specimen: Nasopharyngeal  Result Value Ref Range Status   MRSA by PCR NEGATIVE NEGATIVE Final    Comment:        The GeneXpert MRSA Assay (FDA approved for NASAL specimens only), is one component of a comprehensive MRSA colonization surveillance program. It is  not intended to diagnose MRSA infection nor to guide or monitor treatment for MRSA infections. Performed at [facility], [address] [place], [place] [address]  Imaging Studies   US Venous Img Lower Bilateral (DVT)  Result Date: 02/11/2020 CLINICAL DATA:  Bilateral lower extremity pain and swelling. History of pulmonary embolism post recent pulmonary thrombectomy (02/10/2020). Evaluate for DVT. EXAM: BILATERAL LOWER EXTREMITY VENOUS DOPPLER ULTRASOUND TECHNIQUE: Gray-scale sonography with graded compression, as well as color Doppler and duplex ultrasound were performed to evaluate the lower extremity deep venous systems from the level of the common femoral vein and including the common femoral, femoral, profunda femoral, popliteal and calf veins including the posterior tibial, peroneal and gastrocnemius veins when visible. The superficial great saphenous vein was also interrogated. Spectral Doppler was utilized to evaluate flow at rest and with distal augmentation maneuvers in the common femoral, femoral and popliteal veins. COMPARISON:  None. FINDINGS: RIGHT LOWER EXTREMITY Common Femoral Vein: No evidence of thrombus. Normal compressibility, respiratory phasicity and response to augmentation. Saphenofemoral Junction: No evidence of thrombus. Normal compressibility and flow on color Doppler imaging. Profunda Femoral Vein: No evidence of thrombus. Normal compressibility and flow on color Doppler imaging. Femoral Vein: No evidence of thrombus. Normal compressibility, respiratory phasicity and response to augmentation. Popliteal Vein: No evidence of thrombus. Normal compressibility, respiratory phasicity and response to augmentation. Calf Veins: No evidence of thrombus. Normal compressibility and flow on color Doppler imaging. Superficial Great Saphenous Vein: No evidence of thrombus. Normal compressibility. Venous Reflux:  None. Other Findings: Note is made of a vascular stent  involving the proximal (image 10) mid (image 13 and distal (image 16) aspects of the right femoral vein, the patency of which is not evaluated on this examination. LEFT LOWER EXTREMITY Common Femoral Vein: No evidence of thrombus. Normal compressibility, respiratory phasicity and response to augmentation. Saphenofemoral Junction: No evidence of thrombus. Normal compressibility and flow on color Doppler imaging. Profunda Femoral Vein: No evidence of thrombus. Normal compressibility and flow on color Doppler imaging. Femoral Vein: No evidence of thrombus. Normal compressibility, respiratory phasicity and response to augmentation. Popliteal Vein: No evidence of thrombus. Normal compressibility, respiratory phasicity and response to augmentation. Calf Veins: No evidence of thrombus. Normal compressibility and flow on color Doppler imaging. Superficial Great Saphenous Vein: No evidence of thrombus. Normal compressibility. Venous Reflux:  None. Other Findings:  None IMPRESSION: No evidence of DVT within either lower extremity. Electronically Signed   By: Simonne Come M.D.   On: 02/11/2020 15:35     Medications   Scheduled Meds: . apixaban  10 mg Oral BID   Followed by  . [START ON 02/18/2020] apixaban  5 mg Oral BID  . atorvastatin  40 mg Oral Daily  . carbamide peroxide  10 drop Right EAR BID  . famotidine  20 mg Oral Daily  . insulin aspart  0-5 Units Subcutaneous QHS  . insulin aspart  0-9 Units Subcutaneous TID WC  . methadone  100 mg Oral Daily   Continuous Infusions:     LOS: 5 days    Time spent: 30 minutes    Pennie Banter, DO [facility]  02/12/2020, 4:34 PM    If 7PM-7AM, please contact night-coverage. How to contact the [facility] Attending or Consulting provider 7A - 7P or covering provider during after hours 7P -7A, for this patient?    1. Check the care team in [facility] and look for a) attending/consulting [facility] provider listed and b) the [facility] team listed 2. Log into  [email or web address] and use [place] [place]'s universal password to access. If you do not have the password, please contact the hospital operator. 3. Locate the [facility]  provider you are looking for under [facility] and page to a number that you can be directly reached. 4. If you still have difficulty reaching the provider, please page the [facility] (Director on Call) for the Hospitalists listed on amion for assistance.

## 2020-02-12 NOTE — Progress Notes (Signed)
PHARMACY CONSULT NOTE - FOLLOW UP  Pharmacy Consult for Electrolyte Monitoring and Replacement   Recent Labs: Potassium (mmol/L)  Date Value  02/12/2020 4.3   Magnesium (mg/dL)  Date Value  48/18/5631 1.7   Calcium (mg/dL)  Date Value  49/70/2637 8.4 (L)   Albumin (g/dL)  Date Value  85/88/5027 3.4 (L)   Phosphorus (mg/dL)  Date Value  74/06/8785 3.0   Sodium (mmol/L)  Date Value  02/12/2020 139   Assessment: 64 yo female admitted with acute hypoxic respiratory failure secondary to bilateral pulmonary emboli including small saddle embolus in the distal right pulmonary artery.  Pharmacy asked to replace lytes per protocol.   Goal of Therapy:  Electrolytes WNL, K+ >/= 4  Plan:  --Magnesium 1.7, will order magnesium sulfate 2 g IV x 1. --Will continue to monitor and adjust per protocol.  Tressie Ellis 02/12/2020 11:30 AM

## 2020-02-12 NOTE — Progress Notes (Signed)
SATURATION QUALIFICATIONS: (This note is used to comply with regulatory documentation for home oxygen)  Patient Saturations on Room Air at Rest =90%  Patient Saturations on Room Air while Ambulating = 81%  Patient Saturations on 3 Liters of oxygen while Ambulating = 90%  Please briefly explain why patient needs home oxygen:

## 2020-02-12 NOTE — Evaluation (Signed)
Physical Therapy Evaluation Patient Details Name: Megan James MRN: [id number] DOB: August 15, 1955 Today's Date: 02/12/2020   History of Present Illness  presented to ER secondary to acute onset SOB; admitted for management of bilat (R > L) PE with saddle embolus to distal R pulmonary artery and associated R heart stratin.  s/p thrombectomy (R middle lobe, R lower lobe, L lower lobe) with tPA administration (7/26)  Clinical Impression  Upon evaluation, patient alert and oriented; follows commands and demonstrates good effort with mobility tasks.  Pleasant and cooperative, agreeable to participation with session.  Bilat UE/LE strength and ROM grossly symmetrical and WFL; no focal weakness appreciated.  Do note history of (well-healed) R transmet amputation; has orthotic shoe, but does not like to use.  Able to complete bed mobility with mod indep; sit/stand, basic transfers and gait (125') without assist device, cga/close sup.  Demonstrates reciprocal stepping pattern with fair step height/length; decreased step length R LE, flat foot contact throughout.  Guarded trunk posturing with decreased bilat arm swing; decreased cadence, but no buckling or LOB.  Mod SOB, SaO2 89% on 3L supplemental O2 with exertion.  Do anticipate need for home O2 at discharge; RN/MD/TOC informed and aware. Would benefit from skilled PT to address above deficits and promote optimal return to PLOF.; Recommend transition to HHPT upon discharge from acute hospitalization.  SaO2 on room air at rest = 90% SaO2 on room air while ambulating = 81% SaO2 on 3 liters of O2 while ambulating = 89-90%      Follow Up Recommendations Home health PT    Equipment Recommendations       Recommendations for Other Services       Precautions / Restrictions Precautions Precautions: Fall Restrictions Weight Bearing Restrictions: No      Mobility  Bed Mobility Overal bed mobility: Modified Independent                 Transfers Overall transfer level: Needs assistance Equipment used: None Transfers: Sit to/from Stand Sit to Stand: Min guard;Supervision         General transfer comment: fair/good LE strength and power with movement transition  Ambulation/Gait Ambulation/Gait assistance: Min guard Gait Distance (Feet): 125 Feet Assistive device: None       General Gait Details: reciprocal stepping pattern with fair step height/length; decreased step length R LE, flat foot contact throughout.  Guarded trunk posturing with decreased bilat arm swing; decreased cadence, but no buckling or LOB.  Mod SOB, SaO2 89% on 3L supplemental O2 with exertion.  Stairs            Wheelchair Mobility    Modified Rankin (Stroke Patients Only)       Balance Overall balance assessment: Needs assistance Sitting-balance support: No upper extremity supported;Feet supported Sitting balance-Leahy Scale: Good     Standing balance support: No upper extremity supported Standing balance-Leahy Scale: Fair                               Pertinent Vitals/Pain Pain Assessment: No/denies pain    Home Living Family/patient expects to be discharged to:: Private residence Living Arrangements: Children Available Help at Discharge: Family Type of Home: Apartment Home Access: Level entry     Home Layout: One level Home Equipment: None      Prior Function Level of Independence: Independent         Comments: Indep for ADLs, household and limited community mobilization without assist  device; denies fall history; no home O2.     Hand Dominance        Extremity/Trunk Assessment   Upper Extremity Assessment Upper Extremity Assessment: Overall WFL for tasks assessed (grossly at least 4 to 4+/5 throughout)    Lower Extremity Assessment Lower Extremity Assessment: Overall WFL for tasks assessed (grossly at least 4- to 4/5 throughout, except bilat ankle DF 3+/5.  R transmet amp  (well-healed) noted.)       Communication   Communication: No difficulties  Cognition Arousal/Alertness: Awake/alert Behavior During Therapy: WFL for tasks assessed/performed Overall Cognitive Status: Within Functional Limits for tasks assessed                                        General Comments      Exercises Other Exercises Other Exercises: 5x sit/stand without assist device, cga/close sup-42.72 seconds, does require UE support to assist with lift off and descent.  Decreased compared to age-matched norms, indicative of decreased LE power, mild increase in fall risk. Other Exercises: Reviewed pursed lip breathing techniques, importance of graded activity to maximize oxygenation with functional activities.   Assessment/Plan    PT Assessment Patient needs continued PT services  PT Problem List Decreased strength;Decreased activity tolerance;Decreased balance;Decreased mobility;Decreased coordination;Decreased cognition;Decreased knowledge of use of DME;Decreased safety awareness;Decreased knowledge of precautions;Cardiopulmonary status limiting activity       PT Treatment Interventions DME instruction;Gait training;Functional mobility training;Therapeutic activities;Therapeutic exercise;Balance training;Cognitive remediation;Patient/family education    PT Goals (Current goals can be found in the Care Plan section)  Acute Rehab PT Goals Patient Stated Goal: to return home PT Goal Formulation: With patient Time For Goal Achievement: 02/26/20 Potential to Achieve Goals: Good    Frequency Min 2X/week   Barriers to discharge        Co-evaluation               AM-PAC PT "6 Clicks" Mobility  Outcome Measure Help needed turning from your back to your side while in a flat bed without using bedrails?: None Help needed moving from lying on your back to sitting on the side of a flat bed without using bedrails?: None Help needed moving to and from a bed to  a chair (including a wheelchair)?: A Little Help needed standing up from a chair using your arms (e.g., wheelchair or bedside chair)?: A Little Help needed to walk in hospital room?: A Little Help needed climbing 3-5 steps with a railing? : A Little 6 Click Score: 20    End of Session Equipment Utilized During Treatment: Gait belt Activity Tolerance: Patient tolerated treatment well Patient left: in chair;with call bell/phone within reach;with chair alarm set Nurse Communication: Mobility status PT Visit Diagnosis: Muscle weakness (generalized) (M62.81);Difficulty in walking, not elsewhere classified (R26.2)    Time: 1440-1505 PT Time Calculation (min) (ACUTE ONLY): 25 min   Charges:   PT Evaluation $PT Eval Moderate Complexity: 1 Mod PT Treatments $Therapeutic Activity: 8-22 mins        Tereka Thorley H. Manson Passey, PT, DPT, NCS 02/12/20, 3:04 PM [phone]

## 2020-02-12 NOTE — Hospital Course (Signed)
Megan James is a 64 y.o. female with PMH of Type II Diabetes Mellitus, PAD s/p femoral stents, CAD s/p coronary stent, CHF.  She presented to [facility] ER on 07/23 via EMS with c/o worsening shortness of breath and right lower chest pain 3 days prior to admission. She also reports recent travel from [place] where she resides.    She was found to have acute bilateral pulmonary embolism with saddle embolus and right heart strain complicated by acute hypoxemic respiratory failure with oxygen saturation in the 70s.  She was treated with IV heparin infusion and supplemental oxygen.  She was seen by the vascular surgeon and she underwent thrombolysis of bilateral pulmonary arteries and mechanical thrombectomy of right middle lobe and right lower lobe pulmonary arteries.

## 2020-02-13 LAB — CBC
HCT: 31.2 % — ABNORMAL LOW (ref 36.0–46.0)
Hemoglobin: 10 g/dL — ABNORMAL LOW (ref 12.0–15.0)
MCH: 28.2 pg (ref 26.0–34.0)
MCHC: 32.1 g/dL (ref 30.0–36.0)
MCV: 87.9 fL (ref 80.0–100.0)
Platelets: 238 10*3/uL (ref 150–400)
RBC: 3.55 MIL/uL — ABNORMAL LOW (ref 3.87–5.11)
RDW: 14.9 % (ref 11.5–15.5)
WBC: 9.5 10*3/uL (ref 4.0–10.5)
nRBC: 0 % (ref 0.0–0.2)

## 2020-02-13 LAB — MAGNESIUM: Magnesium: 1.6 mg/dL — ABNORMAL LOW (ref 1.7–2.4)

## 2020-02-13 LAB — GLUCOSE, CAPILLARY
Glucose-Capillary: 92 mg/dL (ref 70–99)
Glucose-Capillary: 102 mg/dL — ABNORMAL HIGH (ref 70–99)

## 2020-02-13 MED ORDER — APIXABAN 5 MG PO TABS: ORAL_TABLET | ORAL | 0 refills | Status: AC

## 2020-02-13 MED ORDER — MAGNESIUM SULFATE 2 GM/50ML IV SOLN
2.0000 g | Freq: Once | INTRAVENOUS | Status: AC
  Administered 2020-02-13: 2 g via INTRAVENOUS
  Filled 2020-02-13: qty 50

## 2020-02-13 NOTE — Progress Notes (Signed)
Mobility Specialist - Progress Note   02/13/20 1238  Mobility  Activity Ambulated in room;Ambulated in hall  Level of Assistance Standby assist, set-up cues, supervision of patient - no hands on (CGA for safety precautions)  Assistive Device None  Distance Ambulated (ft) 180 ft  Mobility Response Tolerated well  Mobility performed by Mobility specialist  $Mobility charge 1 Mobility    Pre-mobility: 64 HR, 176/96 BP, 96% SpO2 During mobility: 77 HR, 91% SpO2 Post-mobility: 66 HR, 151/98 BP, 97% SpO2  Pt was sitting EOB with NT present in room upon arrival. Pt agreed to session. Pt went to [facility] piror to ambulating hallways. Pt needed SBA throughout session, CGA for safety. Pt utilized 3L O2 [place]. Pt showed heavy breathing during ambulation, however, had no c/o of SOB and wanted to complete the lap. Pt O2 desat to 91% during ambulation. After session, O2 sitting at 97%. Overall, pt tolerated session well. Pt left sitting EOB to eat lunch. Nurse was notified.    Rockey Guarino Mobility Specialist  02/13/20, 12:47 PM

## 2020-02-13 NOTE — Progress Notes (Signed)
Patient discharged after arrival of needed oxygen. AVS given to patient. Patient acknowledged understanding of discharge orders. Patient with all belonging with her at discharge. Sister at bedside for transport. Patient transported via wheelchair to sister's vehicle.

## 2020-02-13 NOTE — Progress Notes (Signed)
AVS discharge instructions reviewed with patient by Alvan Dame

## 2020-02-13 NOTE — Clinical Social Work Note (Addendum)
[employer] will provide 02 at bedside. Pt is transferring back to [place] on Saturday at this time CSW is unable to set up [facility]. Pt will need to reach ou to PCP when she gets back in [place].  [place], [place] [phone]

## 2020-02-13 NOTE — Discharge Summary (Signed)
Physician Discharge Summary  Megan James RXY:[id number] DOB: [birth year] DOA: 02/07/2020  PCP: Patient, No Pcp Per  Admit date: 02/07/2020 Discharge date: 02/13/2020  Admitted From: home, from [place], visiting family locally Disposition:  home  Recommendations for Outpatient Follow-up:  1. Follow up with PCP in 1-2 weeks 2. Please obtain BMP/CBC in one week 3. Please follow up with patient regarding her oxygen requirement and compliance with Eliquis for acute PE  Home Health: PT  Equipment/Devices: None   Discharge Condition: Stable  CODE STATUS: Full  Diet recommendation: Heart Healthy   Discharge Diagnoses: Principal Problem:   Acute respiratory failure with hypoxia (HCC) Active Problems:   Elevated troponin   Acute saddle pulmonary embolism (HCC)   CKD (chronic kidney disease) stage 3, GFR 30-59 ml/min   Type 2 diabetes mellitus with other specified complication (HCC)   CAD (coronary artery disease)   PAD (peripheral artery disease) (HCC)    Summary of HPI and Hospital Course:  Megan James is a 64 y.o. female with PMH of Type II Diabetes Mellitus, PAD s/p femoral stents, CAD s/p coronary stent, CHF.  She presented to [facility] ER on 07/23 via EMS with c/o worsening shortness of breath and right lower chest pain 3 days prior to admission. She also reports recent travel from [place] where she resides.    She was found to have acute bilateral pulmonary embolism with saddle embolus and right heart strain complicated by acute hypoxemic respiratory failure with oxygen saturation in the 70s.  She was treated with IV heparin infusion and supplemental oxygen.  She was seen by the vascular surgeon and she underwent thrombolysis of bilateral pulmonary arteries and mechanical thrombectomy of right middle lobe and right lower lobe pulmonary arteries.    Acute respiratory failure with hypoxia secondary to  Acute bilateral pulmonary embolisms -present on admission, status post pulmonary  thrombolysis and mechanical thrombectomy on 7/26.  Vascular surgery consulted.  Lower extremity venous Dopplers were negative for DVTs.   Continue aspirin.   Plavix has been discontinued now the patient is on Eliquis. Continue Eliquis 10 mg twice daily x7 days then 5 mg twice daily thereafter.  Continue supplemental oxygen to maintain O2 sat above 90%. Home oxygen has been ordered (qualified for 3 L/min).  Hypomagnesemia -replacing by IV.  Repeat without follow up labs.  Replace as needed.  Elevated troponin - POA, likely demand ischemia in the setting of acute PE and hypoxia.  No chest pain or acute ischemic changes on EKG.  CKD stage IIIa -patient resides in [place] so no baseline renal function is available.  Monitor BMP.  Avoid nephrotoxins and hypotension.  Renally dose meds as indicated.  Type 2 diabetes - covered with sliding scale NovoLog during admission.  Coronary artery disease /peripheral vascular disease status post femoral stents -chronic, stable.  Continue aspirin.  Plavix was discontinued after discussion with vascular surgeon as patient was started on Eliquis for acute PE.  COPD -not acutely exacerbated  Chronic pain -continue methadone   Discharge Instructions   Discharge Instructions    Call MD for:   Complete by: As directed    Bleeding if difficult to stop, or seeing blood in stool or urine.   Call MD for:  extreme fatigue   Complete by: As directed    Call MD for:  persistant dizziness or light-headedness   Complete by: As directed    Call MD for:  temperature >100.4   Complete by: As directed    Diet -  low sodium heart healthy   Complete by: As directed    Discharge instructions   Complete by: As directed    Eliquis (apixaban) is a blood thinner you will need to take due to the blood clots you had in your lungs.   Take 10 mg twice daily for the next 4 days, then reduce to 5 mg twice daily. Your primary care doctor will refill this medication, and  will let you know how long you need to continue on it.  You should continue to take your Aspirin, but STOP PLAVIX (increases risk of bleeding when taken along with Eliquis).  Use supplemental oxygen as needed to keep your oxygen level at or above 90%.   Increase activity slowly   Complete by: As directed      Allergies as of 02/13/2020   No Known Allergies     Medication List    STOP taking these medications   NIFEdipine 90 MG 24 hr tablet Commonly known as: PROCARDIA XL/NIFEDICAL-XL   Plavix 75 MG tablet Generic drug: clopidogrel     TAKE these medications   apixaban 5 MG Tabs tablet Commonly known as: Eliquis Take 2 tablets ( ) twice daily for 4 days, then 1 tablet ( ) twice daily   Aspirin Low Dose 81 MG EC tablet Generic drug: aspirin Take 81 mg by mouth daily.   atorvastatin 40 MG tablet Commonly known as: LIPITOR Take 40 mg by mouth daily.   enalapril 20 MG tablet Commonly known as: VASOTEC Take 20 mg by mouth daily.   GNP ACID REDUCER MAXIMUM ST 20 MG tablet Generic drug: famotidine Take 20 mg by mouth at bedtime.   isosorbide mononitrate 60 MG 24 hr tablet Commonly known as: IMDUR Take 60 mg by mouth daily.   methadone 10 MG/ML solution Commonly known as: DOLOPHINE Take 150 mg by mouth daily.   Vitamin D (Ergocalciferol) 1.25 MG (50000 UNIT) Caps capsule Commonly known as: DRISDOL Take 50,000 Units by mouth once a week.            Durable Medical Equipment  (From admission, onward)         Start     Ordered   02/12/20 1520  For home use only DME oxygen  Once       Question Answer Comment  Length of Need 6 Months   Mode or (Route) Nasal cannula   Liters per Minute 3   Frequency Continuous (stationary and portable oxygen unit needed)   Oxygen delivery system Gas      02/12/20 1519          No Known Allergies  Consultations:  Vascular surgery   Procedures/Studies: CT Angio Chest PE W and/or Wo Contrast  Result Date:  02/07/2020 CLINICAL DATA:  Shortness of breath EXAM: CT ANGIOGRAPHY CHEST WITH CONTRAST TECHNIQUE: Multidetector CT imaging of the chest was performed using the standard protocol during bolus administration of intravenous contrast. Multiplanar CT image reconstructions and MIPs were obtained to evaluate the vascular anatomy. CONTRAST:  60mL OMNIPAQUE IOHEXOL 350 MG/ML SOLN COMPARISON:  Chest x-ray 02/07/2020 FINDINGS: Cardiovascular: Satisfactory opacification of the pulmonary arteries to the segmental level. Small saddle embolus within the distal right pulmonary artery. Acute nonocclusive embolus within right descending pulmonary artery with extension of thrombus into right upper and lower lobe segmental and subsegmental vessels. Small amount of right middle lobe segmental and subsegmental thrombus. Acute left lower lobe segmental and subsegmental emboli. RV LV ratio slightly elevated at 1.0. Dilated pulmonary trunk up  to 4 cm. Ectatic ascending aorta without aneurysm. Mild aortic atherosclerosis. Coronary vascular calcification. Cardiomegaly with no significant pericardial effusion Mediastinum/Nodes: Midline trachea. No thyroid mass. No suspicious nodes. Lungs/Pleura: Mild emphysema. No consolidation or pleural effusion. Subsegmental atelectasis in the lower lobes. No pneumothorax. Upper Abdomen: No acute abnormality. Musculoskeletal: No chest wall abnormality. No acute or significant osseous findings. Review of the MIP images confirms the above findings. IMPRESSION: 1. Positive for acute right greater than left bilateral pulmonary emboli including small saddle embolus in the distal right pulmonary artery. Elevation of RV LV ratio at 1.0, suspect for component of right heart strain, however there are other features such as dilatation of the pulmonary trunk and cardiomegaly suggesting underlying chronic disease. 2. Subsegmental atelectasis at the lower lobes.  Mild emphysema Aortic Atherosclerosis (ICD10-I70.0) and  Emphysema (ICD10-J43.9). Critical Value/emergent results were called by telephone at the time of interpretation on 02/07/2020 at 8:24 pm to provider PHILLIP STAFFORD , who verbally acknowledged these results. Electronically Signed   By: Jasmine PangKim  Fujinaga M.D.   On: 02/07/2020 20:24   PERIPHERAL VASCULAR CATHETERIZATION  Result Date: 02/10/2020 See op note  US Venous Img Lower Bilateral (DVT)  Result Date: 02/11/2020 CLINICAL DATA:  Bilateral lower extremity pain and swelling. History of pulmonary embolism post recent pulmonary thrombectomy (02/10/2020). Evaluate for DVT. EXAM: BILATERAL LOWER EXTREMITY VENOUS DOPPLER ULTRASOUND TECHNIQUE: Gray-scale sonography with graded compression, as well as color Doppler and duplex ultrasound were performed to evaluate the lower extremity deep venous systems from the level of the common femoral vein and including the common femoral, femoral, profunda femoral, popliteal and calf veins including the posterior tibial, peroneal and gastrocnemius veins when visible. The superficial great saphenous vein was also interrogated. Spectral Doppler was utilized to evaluate flow at rest and with distal augmentation maneuvers in the common femoral, femoral and popliteal veins. COMPARISON:  None. FINDINGS: RIGHT LOWER EXTREMITY Common Femoral Vein: No evidence of thrombus. Normal compressibility, respiratory phasicity and response to augmentation. Saphenofemoral Junction: No evidence of thrombus. Normal compressibility and flow on color Doppler imaging. Profunda Femoral Vein: No evidence of thrombus. Normal compressibility and flow on color Doppler imaging. Femoral Vein: No evidence of thrombus. Normal compressibility, respiratory phasicity and response to augmentation. Popliteal Vein: No evidence of thrombus. Normal compressibility, respiratory phasicity and response to augmentation. Calf Veins: No evidence of thrombus. Normal compressibility and flow on color Doppler imaging. Superficial  Great Saphenous Vein: No evidence of thrombus. Normal compressibility. Venous Reflux:  None. Other Findings: Note is made of a vascular stent involving the proximal (image 10) mid (image 13 and distal (image 16) aspects of the right femoral vein, the patency of which is not evaluated on this examination. LEFT LOWER EXTREMITY Common Femoral Vein: No evidence of thrombus. Normal compressibility, respiratory phasicity and response to augmentation. Saphenofemoral Junction: No evidence of thrombus. Normal compressibility and flow on color Doppler imaging. Profunda Femoral Vein: No evidence of thrombus. Normal compressibility and flow on color Doppler imaging. Femoral Vein: No evidence of thrombus. Normal compressibility, respiratory phasicity and response to augmentation. Popliteal Vein: No evidence of thrombus. Normal compressibility, respiratory phasicity and response to augmentation. Calf Veins: No evidence of thrombus. Normal compressibility and flow on color Doppler imaging. Superficial Great Saphenous Vein: No evidence of thrombus. Normal compressibility. Venous Reflux:  None. Other Findings:  None IMPRESSION: No evidence of DVT within either lower extremity. Electronically Signed   By: Simonne ComeJohn  Watts M.D.   On: 02/11/2020 15:35   DG Chest Portable 1 View  Result Date: 02/07/2020 CLINICAL DATA:  Shortness of breath with hypoxia EXAM: PORTABLE CHEST 1 VIEW COMPARISON:  None. FINDINGS: Streaky bibasilar opacities. No pleural effusion. Borderline to mild cardiomegaly with aortic atherosclerosis. No pneumothorax. IMPRESSION: Streaky bibasilar opacities, favor atelectasis or scarring over atypical infiltrates. Electronically Signed   By: Jasmine Pang M.D.   On: 02/07/2020 17:29       Subjective: Patient seen this AM.  She agrees to home oxygen although expresses doubt she needs it.  Encouraged compliance and close follow up.  Denies chest pain SOB or other complaints.    Discharge Exam: Vitals:   02/13/20  0424 02/13/20 0823  BP: (!) 147/81 (!) 173/79  Pulse: 53 53  Resp:  16  Temp: 98.1 F (36.7 C) 97.7 F (36.5 C)  SpO2: 94% 97%   Vitals:   02/13/20 0001 02/13/20 0404 02/13/20 0424 02/13/20 0823  BP: (!) 167/88  (!) 147/81 (!) 173/79  Pulse: 56  53 53  Resp:    16  Temp:   98.1 F (36.7 C) 97.7 F (36.5 C)  TempSrc:    Oral  SpO2: 94%  94% 97%  Weight:  65.7 kg    Height:        General: Pt is alert, awake, not in acute distress Cardiovascular: RRR, S1/S2 +, no rubs, no gallops Respiratory: CTA bilaterally with decreased breath sounds, no wheezing, no rhonchi Abdominal: Soft, NT, ND, bowel sounds + Extremities: no edema, no cyanosis    The results of significant diagnostics from this hospitalization (including imaging, microbiology, ancillary and laboratory) are listed below for reference.     Microbiology: Recent Results (from the past 240 hour(s))  SARS Coronavirus 2 by RT PCR (hospital order, performed in [facility] hospital lab) Nasopharyngeal Nasopharyngeal Swab     Status: None   Collection Time: 02/07/20  6:35 PM   Specimen: Nasopharyngeal Swab  Result Value Ref Range Status   SARS Coronavirus 2 NEGATIVE NEGATIVE Final    Comment: (NOTE) SARS-CoV-2 target nucleic acids are NOT DETECTED.  The SARS-CoV-2 RNA is generally detectable in upper and lower respiratory specimens during the acute phase of infection. The lowest concentration of SARS-CoV-2 viral copies this assay can detect is 250 copies / mL. A negative result does not preclude SARS-CoV-2 infection and should not be used as the sole basis for treatment or other patient management decisions.  A negative result may occur with improper specimen collection / handling, submission of specimen other than nasopharyngeal swab, presence of viral mutation(s) within the areas targeted by this assay, and inadequate number of viral copies (<250 copies / mL). A negative result must be combined with  clinical observations, patient history, and epidemiological information.  Fact Sheet for Patients:   BoilerBrush.com.cy  Fact Sheet for Healthcare Providers: https://pope.com/  This test is not yet approved or  cleared by the [place] FDA and has been authorized for detection and/or diagnosis of SARS-CoV-2 by FDA under an Emergency Use Authorization (EUA).  This EUA will remain in effect (meaning this test can be used) for the duration of the COVID-19 declaration under Section 564(b)(1) of the Act, 21 U.S.C. section 360bbb-3(b)(1), unless the authorization is terminated or revoked sooner.  Performed at [facility], [address] [place], [place] [address]   MRSA PCR Screening     Status: None   Collection Time: 02/08/20  4:55 AM   Specimen: Nasopharyngeal  Result Value Ref Range Status   MRSA by PCR NEGATIVE NEGATIVE Final  Comment:        The GeneXpert MRSA Assay (FDA approved for NASAL specimens only), is one component of a comprehensive MRSA colonization surveillance program. It is not intended to diagnose MRSA infection nor to guide or monitor treatment for MRSA infections. Performed at [facility], [address] [place], [place] [address]      Labs: BNP (last 3 results) Recent Labs    02/07/20 1650  BNP 299.2*   Basic Metabolic Panel: Recent Labs  Lab 02/08/20 0427 02/08/20 0427 02/09/20 0454 02/10/20 0357 02/11/20 0542 02/12/20 0316 02/13/20 0346  NA 138  --  136 138 136 139  --   K 4.2  --  4.1 4.2 3.7 4.3  --   CL 105  --  105 106 105 107  --   CO2 24  --  24 27 22 26   --   GLUCOSE 82  --  94 75 113* 122*  --   BUN 23  --  18 14 13 11   --   CREATININE 1.37*  --  1.10* 1.13* 1.02* 1.02*  --   CALCIUM 8.3*  --  8.2* 8.6* 8.1* 8.4*  --   MG 1.6*   < > 2.0 1.5* 1.6* 1.7 1.6*  PHOS 2.5  --  2.6 2.5 2.5 3.0  --    < > = values in this interval not displayed.   Liver  Function Tests: Recent Labs  Lab 02/07/20 1723  AST 39  ALT 28  ALKPHOS 68  BILITOT 0.8  PROT 8.9*  ALBUMIN 3.4*   Recent Labs  Lab 02/07/20 1723  LIPASE 27   No results for input(s): AMMONIA in the last 168 hours. CBC: Recent Labs  Lab 02/07/20 1650 02/08/20 0427 02/09/20 0454 02/10/20 0357 02/11/20 0542 02/12/20 0316 02/13/20 0346  WBC 13.4*   < > 9.5 9.7 9.2 9.0 9.5  NEUTROABS 11.0*  --  7.7 6.7 6.0  --   --   HGB 12.6   < > 12.0 12.3 10.4* 10.1* 10.0*  HCT 40.0   < > 38.3 37.8 32.0* 32.9* 31.2*  MCV 89.1   < > 90.3 87.3 87.2 90.6 87.9  PLT 200   < > 186 218 207 230 238   < > = values in this interval not displayed.   Cardiac Enzymes: No results for input(s): CKTOTAL, CKMB, CKMBINDEX, TROPONINI in the last 168 hours. BNP: Invalid input(s): POCBNP CBG: Recent Labs  Lab 02/12/20 0748 02/12/20 1117 02/12/20 1651 02/12/20 2114 02/13/20 0825  GLUCAP 101* 109* 102* 103* 92   D-Dimer No results for input(s): DDIMER in the last 72 hours. Hgb A1c No results for input(s): HGBA1C in the last 72 hours. Lipid Profile No results for input(s): CHOL, HDL, LDLCALC, TRIG, CHOLHDL, LDLDIRECT in the last 72 hours. Thyroid function studies No results for input(s): TSH, T4TOTAL, T3FREE, THYROIDAB in the last 72 hours.  Invalid input(s): FREET3 Anemia work up No results for input(s): VITAMINB12, FOLATE, FERRITIN, TIBC, IRON, RETICCTPCT in the last 72 hours. Urinalysis No results found for: COLORURINE, APPEARANCEUR, LABSPEC, PHURINE, GLUCOSEU, HGBUR, BILIRUBINUR, KETONESUR, PROTEINUR, UROBILINOGEN, NITRITE, LEUKOCYTESUR Sepsis Labs Invalid input(s): PROCALCITONIN,  WBC,  LACTICIDVEN Microbiology Recent Results (from the past 240 hour(s))  SARS Coronavirus 2 by RT PCR (hospital order, performed in [facility] hospital lab) Nasopharyngeal Nasopharyngeal Swab     Status: None   Collection Time: 02/07/20  6:35 PM   Specimen: Nasopharyngeal Swab  Result Value Ref Range  Status  SARS Coronavirus 2 NEGATIVE NEGATIVE Final    Comment: (NOTE) SARS-CoV-2 target nucleic acids are NOT DETECTED.  The SARS-CoV-2 RNA is generally detectable in upper and lower respiratory specimens during the acute phase of infection. The lowest concentration of SARS-CoV-2 viral copies this assay can detect is 250 copies / mL. A negative result does not preclude SARS-CoV-2 infection and should not be used as the sole basis for treatment or other patient management decisions.  A negative result may occur with improper specimen collection / handling, submission of specimen other than nasopharyngeal swab, presence of viral mutation(s) within the areas targeted by this assay, and inadequate number of viral copies (<250 copies / mL). A negative result must be combined with clinical observations, patient history, and epidemiological information.  Fact Sheet for Patients:   BoilerBrush.com.cy  Fact Sheet for Healthcare Providers: https://pope.com/  This test is not yet approved or  cleared by the [place] FDA and has been authorized for detection and/or diagnosis of SARS-CoV-2 by FDA under an Emergency Use Authorization (EUA).  This EUA will remain in effect (meaning this test can be used) for the duration of the COVID-19 declaration under Section 564(b)(1) of the Act, 21 U.S.C. section 360bbb-3(b)(1), unless the authorization is terminated or revoked sooner.  Performed at [facility], [address] [place], [place] [address]   MRSA PCR Screening     Status: None   Collection Time: 02/08/20  4:55 AM   Specimen: Nasopharyngeal  Result Value Ref Range Status   MRSA by PCR NEGATIVE NEGATIVE Final    Comment:        The GeneXpert MRSA Assay (FDA approved for NASAL specimens only), is one component of a comprehensive MRSA colonization surveillance program. It is not intended to diagnose MRSA infection nor to  guide or monitor treatment for MRSA infections. Performed at [facility], [address] [place], [place] [address]      Time coordinating discharge: Over 30 minutes  SIGNED:   Pennie Banter, DO [facility] 02/13/2020, 9:48 AM   If 7PM-7AM, please contact night-coverage [email or web address]

## 2020-02-13 NOTE — Progress Notes (Signed)
PHARMACY CONSULT NOTE - FOLLOW UP  Pharmacy Consult for Electrolyte Monitoring and Replacement   Recent Labs: Potassium (mmol/L)  Date Value  02/12/2020 4.3   Magnesium (mg/dL)  Date Value  46/80/3212 1.6 (L)   Calcium (mg/dL)  Date Value  24/82/5003 8.4 (L)   Albumin (g/dL)  Date Value  70/48/8891 3.4 (L)   Phosphorus (mg/dL)  Date Value  69/45/0388 3.0   Sodium (mmol/L)  Date Value  02/12/2020 139   Assessment: 64 yo female admitted with acute hypoxic respiratory failure secondary to bilateral pulmonary emboli including small saddle embolus in the distal right pulmonary artery.  Pharmacy asked to replace lytes per protocol.   Goal of Therapy:  Electrolytes WNL, K+ >/= 4  Plan:  --Magnesium 1.6, magnesium sulfate 2 g IV x 1 already ordered. No further repletion indicated --Will continue to monitor and adjust per protocol.  Tressie Ellis 02/13/2020 9:23 AM

## 2020-02-13 NOTE — Clinical Social Work Note (Deleted)
Pt qualifies for 02 however, [facility] unable to provide given pt's out of state insurance. CSW has reached out to [facility]. They have a location in [place]. Sent referral to see if they would be willing to provide 02--awaiting response.   [place], [place] [phone]

## 2020-02-13 NOTE — Plan of Care (Signed)
  Problem: Education: Goal: Knowledge of General Education information will improve Description: Including pain rating scale, medication(s)/side effects and non-pharmacologic comfort measures Outcome: Progressing   Problem: Health Behavior/Discharge Planning: Goal: Ability to manage health-related needs will improve Outcome: Progressing   Problem: Clinical Measurements: Goal: Respiratory complications will improve Outcome: Progressing Note: Pt going home with O2 tank

## 2020-12-22 IMAGING — DX DG CHEST 1V PORT
1 series · 1 of 1 positions shown · non-contrast
Comparison: None.

CLINICAL DATA: Shortness of breath with hypoxia

EXAM:
PORTABLE CHEST 1 VIEW

[chest ap]
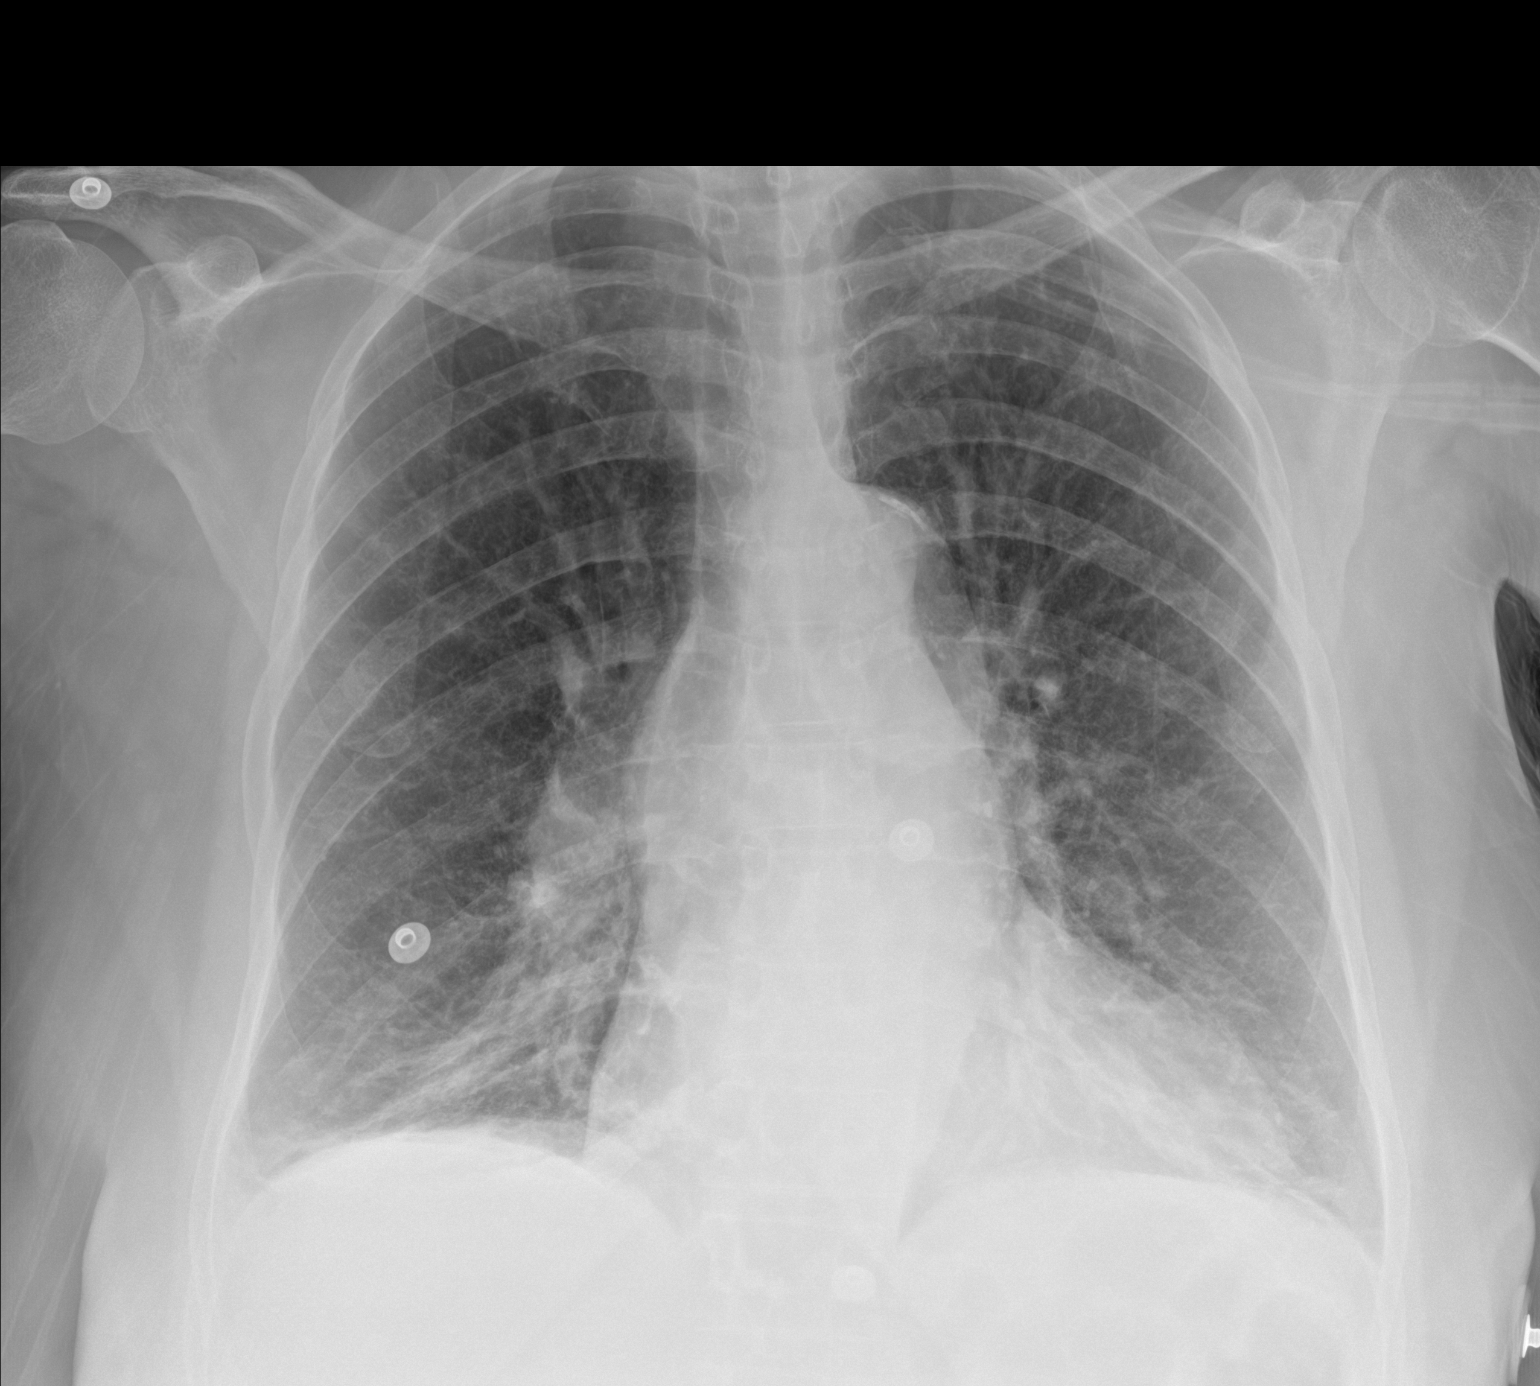

[1 of 1 positions shown; findings below may reference images not displayed]

FINDINGS: Streaky bibasilar opacities. No pleural effusion. Borderline to mild
cardiomegaly with aortic atherosclerosis. No pneumothorax.
IMPRESSION: Streaky bibasilar opacities, favor atelectasis or scarring over
atypical infiltrates.

## 2020-12-26 IMAGING — US US EXTREM LOW VENOUS
1 series · 13 of 24 positions shown · non-contrast
Comparison: None.

CLINICAL DATA: Bilateral lower extremity pain and swelling. History
of pulmonary embolism post recent pulmonary thrombectomy
(02/10/2020). Evaluate for DVT.



[Series 1: us venous img lower bilat (dvt) · portal-venous · 13 of 56 slices shown]
[im 1/56]
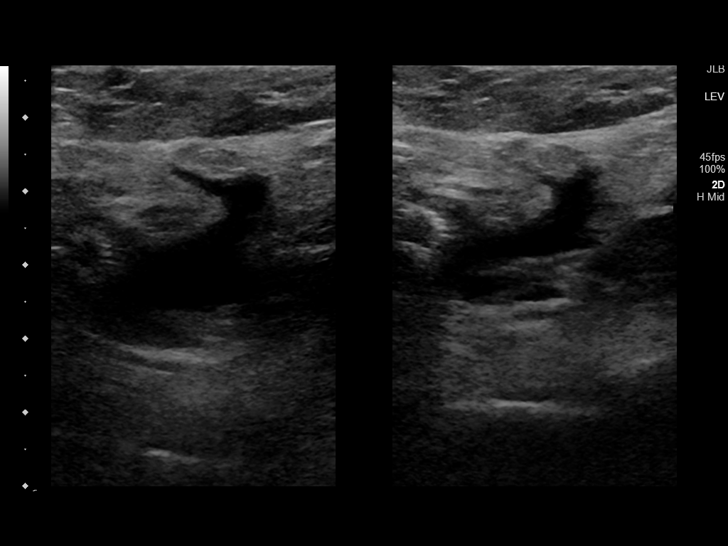
[im 5/56]
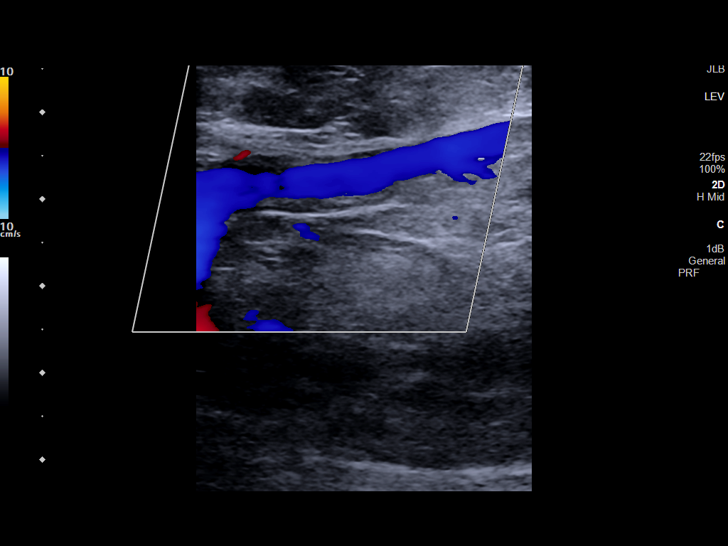
[im 10/56]
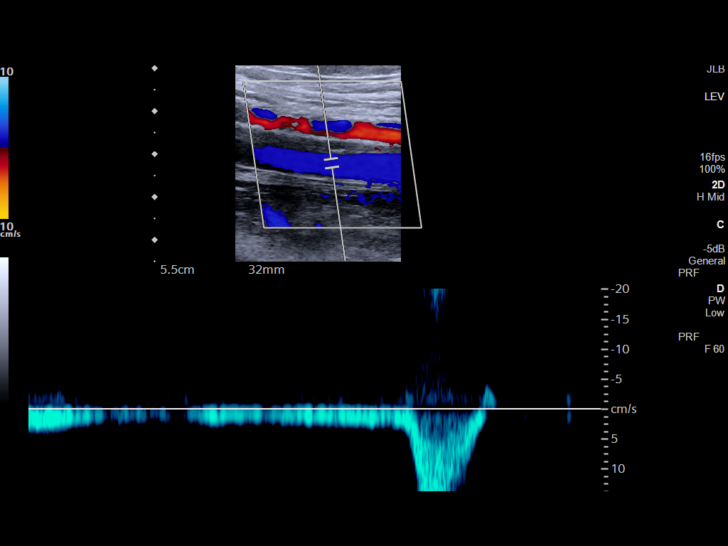
[im 15/56]
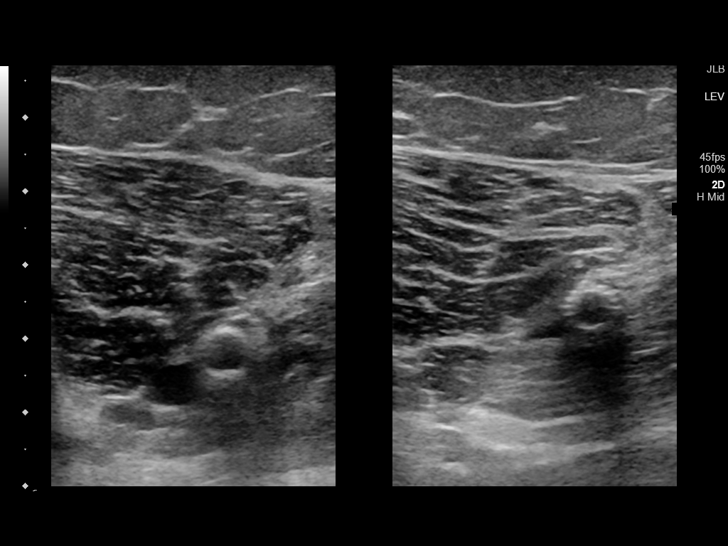
[im 20/56]
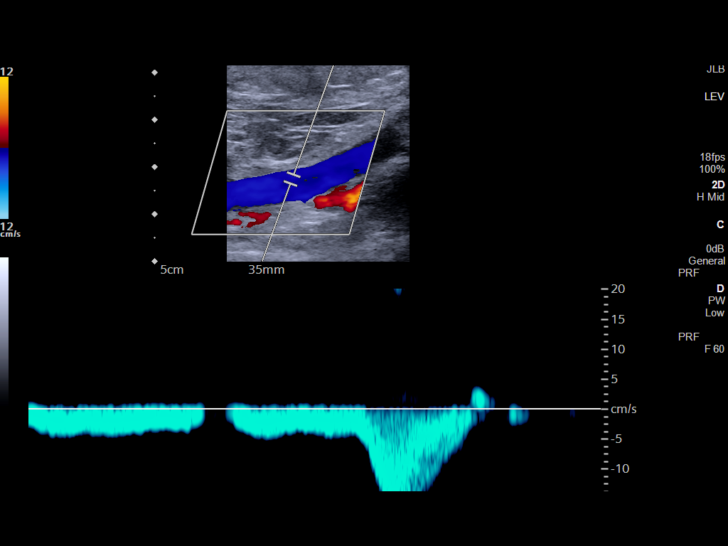
[im 24/56]
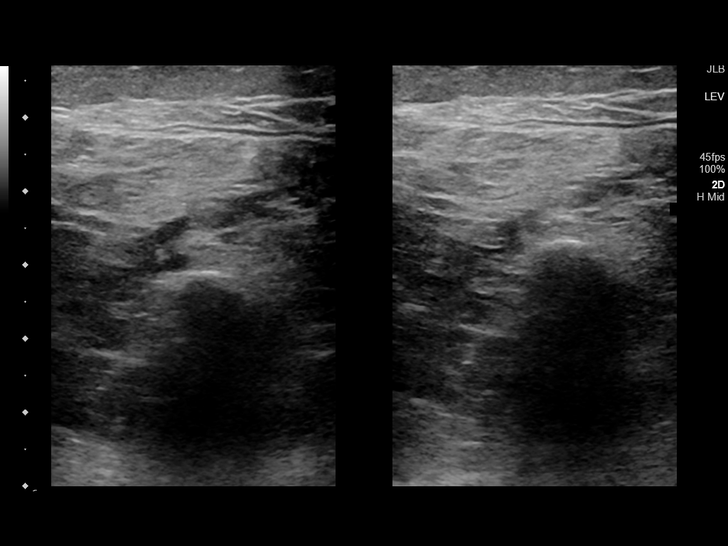
[im 29/56]
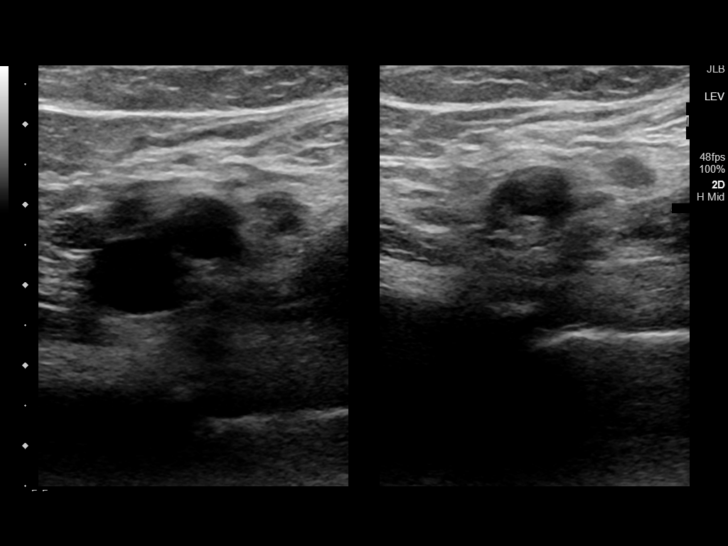
[im 32/56]
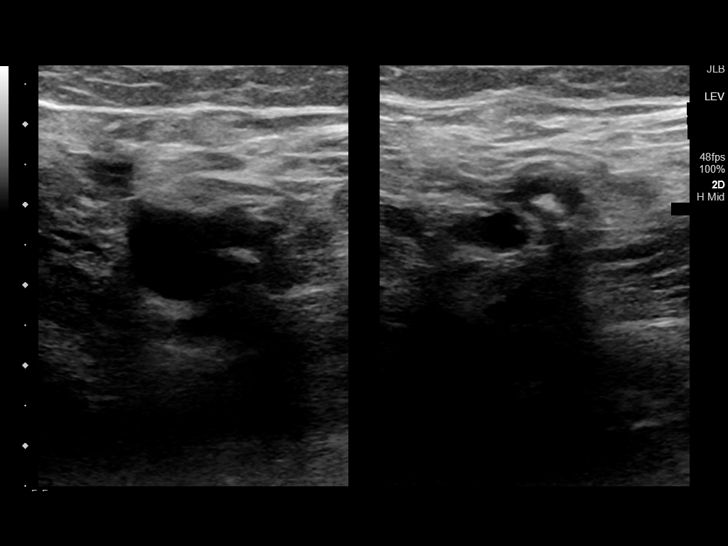
[im 36/56]
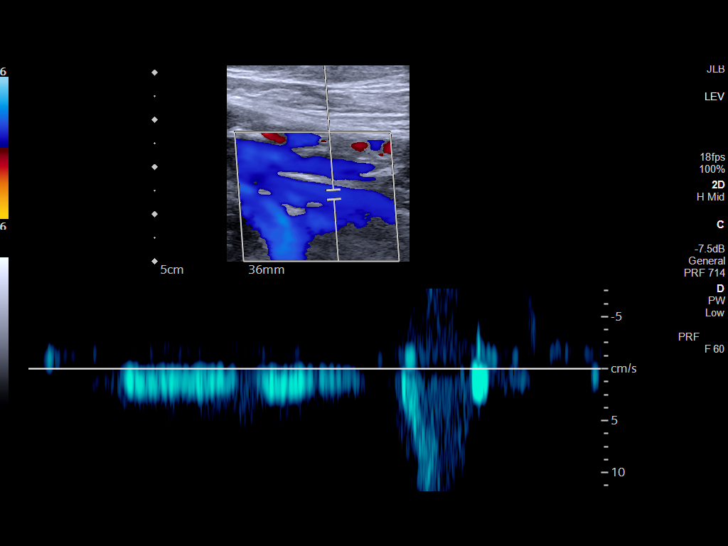
[im 41/56]
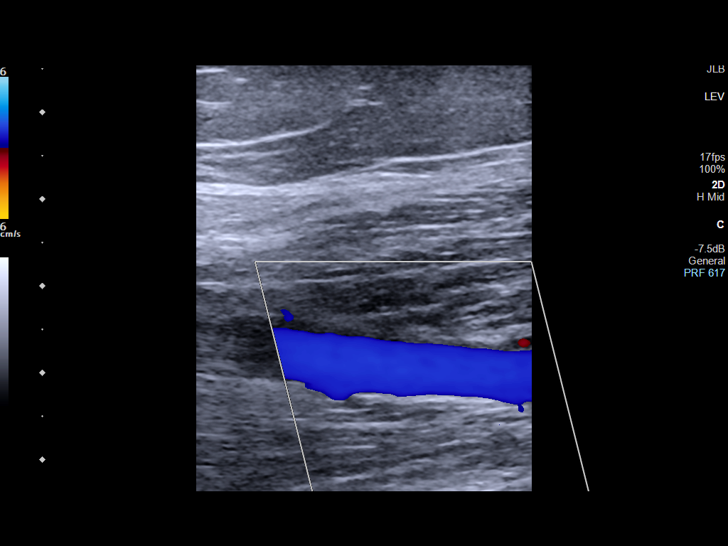
[im 46/56]
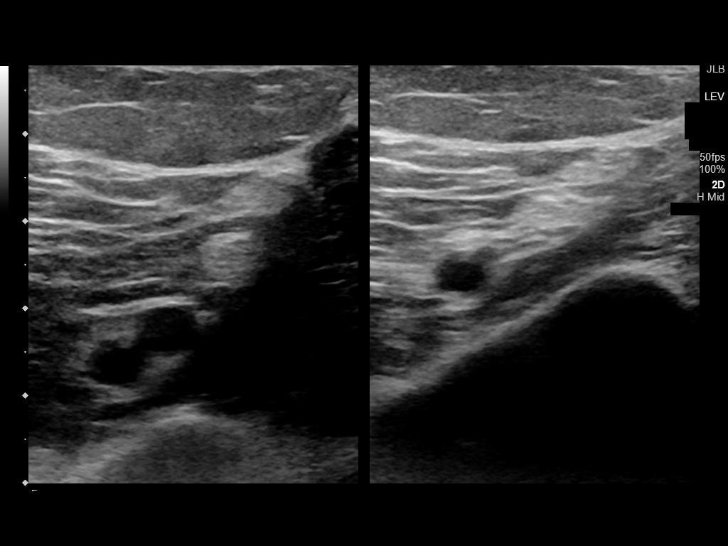
[im 51/56]
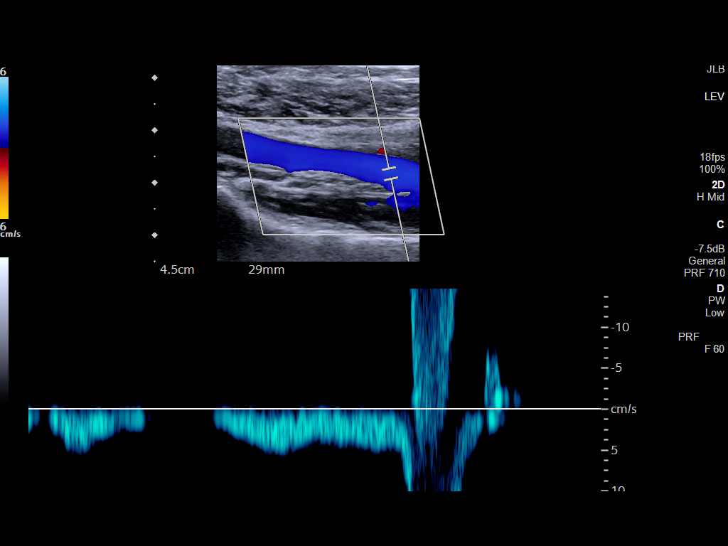
[im 56/56]
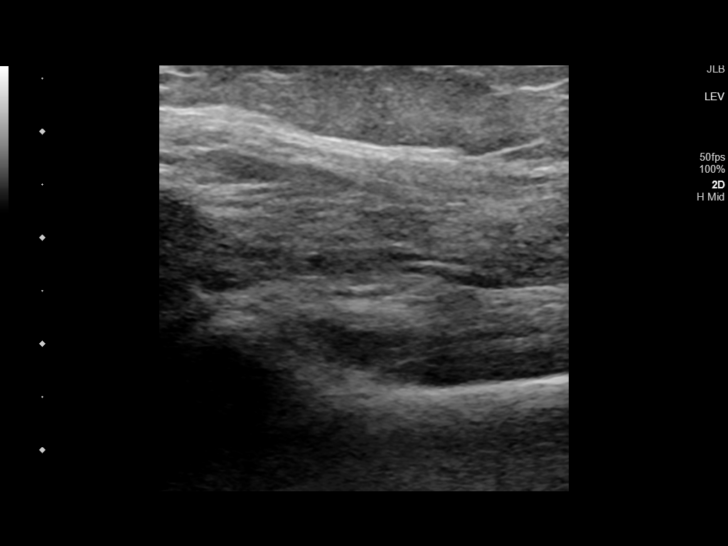

[13 of 24 positions shown; findings below may reference images not displayed]

FINDINGS: RIGHT LOWER EXTREMITY

Common Femoral Vein: No evidence of thrombus. Normal
compressibility, respiratory phasicity and response to augmentation.

Saphenofemoral Junction: No evidence of thrombus. Normal
compressibility and flow on color Doppler imaging.

Profunda Femoral Vein: No evidence of thrombus. Normal
compressibility and flow on color Doppler imaging.

Femoral Vein: No evidence of thrombus. Normal compressibility,
respiratory phasicity and response to augmentation.

Popliteal Vein: No evidence of thrombus. Normal compressibility,
respiratory phasicity and response to augmentation.

Calf Veins: No evidence of thrombus. Normal compressibility and flow
on color Doppler imaging.

Superficial Great Saphenous Vein: No evidence of thrombus. Normal
compressibility.

Venous Reflux:  None.

Other Findings: Note is made of a vascular stent involving the
proximal (image 10) mid (image 13 and distal (image 16) aspects of
the right femoral vein, the patency of which is not evaluated on
this examination.

LEFT LOWER EXTREMITY

Common Femoral Vein: No evidence of thrombus. Normal
compressibility, respiratory phasicity and response to augmentation.

Saphenofemoral Junction: No evidence of thrombus. Normal
compressibility and flow on color Doppler imaging.

Profunda Femoral Vein: No evidence of thrombus. Normal
compressibility and flow on color Doppler imaging.

Femoral Vein: No evidence of thrombus. Normal compressibility,
respiratory phasicity and response to augmentation.

Popliteal Vein: No evidence of thrombus. Normal compressibility,
respiratory phasicity and response to augmentation.

Calf Veins: No evidence of thrombus. Normal compressibility and flow
on color Doppler imaging.

Superficial Great Saphenous Vein: No evidence of thrombus. Normal
compressibility.

Venous Reflux:  None.

Other Findings:  None
IMPRESSION: No evidence of DVT within either lower extremity.
